# Patient Record
Sex: Male | Born: 1960 | Race: Black or African American | Hispanic: No | Smoking: Current every day smoker
Health system: Southern US, Community
[De-identification: ages and names within clinical notes are randomized; demographics above are authoritative.]

## PROBLEM LIST (undated history)

## (undated) DIAGNOSIS — G9341 Metabolic encephalopathy: Secondary | ICD-10-CM

## (undated) DIAGNOSIS — D649 Anemia, unspecified: Secondary | ICD-10-CM

---

## 2005-03-11 ENCOUNTER — Emergency Department: Payer: Self-pay | Admitting: Emergency Medicine

## 2005-05-29 ENCOUNTER — Emergency Department: Payer: Self-pay | Admitting: Emergency Medicine

## 2005-05-30 ENCOUNTER — Emergency Department: Payer: Self-pay | Admitting: Emergency Medicine

## 2014-02-27 ENCOUNTER — Emergency Department: Payer: Self-pay | Admitting: Emergency Medicine

## 2014-07-16 ENCOUNTER — Emergency Department
Admit: 2014-07-16 | Disposition: A | Discharge status: Home / Self Care | Payer: Self-pay | Admitting: Emergency Medicine

## 2014-07-19 LAB — BETA STREP CULTURE(ARMC)

## 2016-03-03 IMAGING — CR NECK SOFT TISSUES - 1+ VIEW
1 series · 2 of 2 positions shown · non-contrast
Comparison: None.

CLINICAL DATA: Dysphagia with feeling of fullness in throat for 1
day

EXAM:
NECK SOFT TISSUES - 1+ VIEW

[Series 1: dxr soft tissue neck · 0.14mm/px · 2 of 2 slices shown]
[im 1/2]
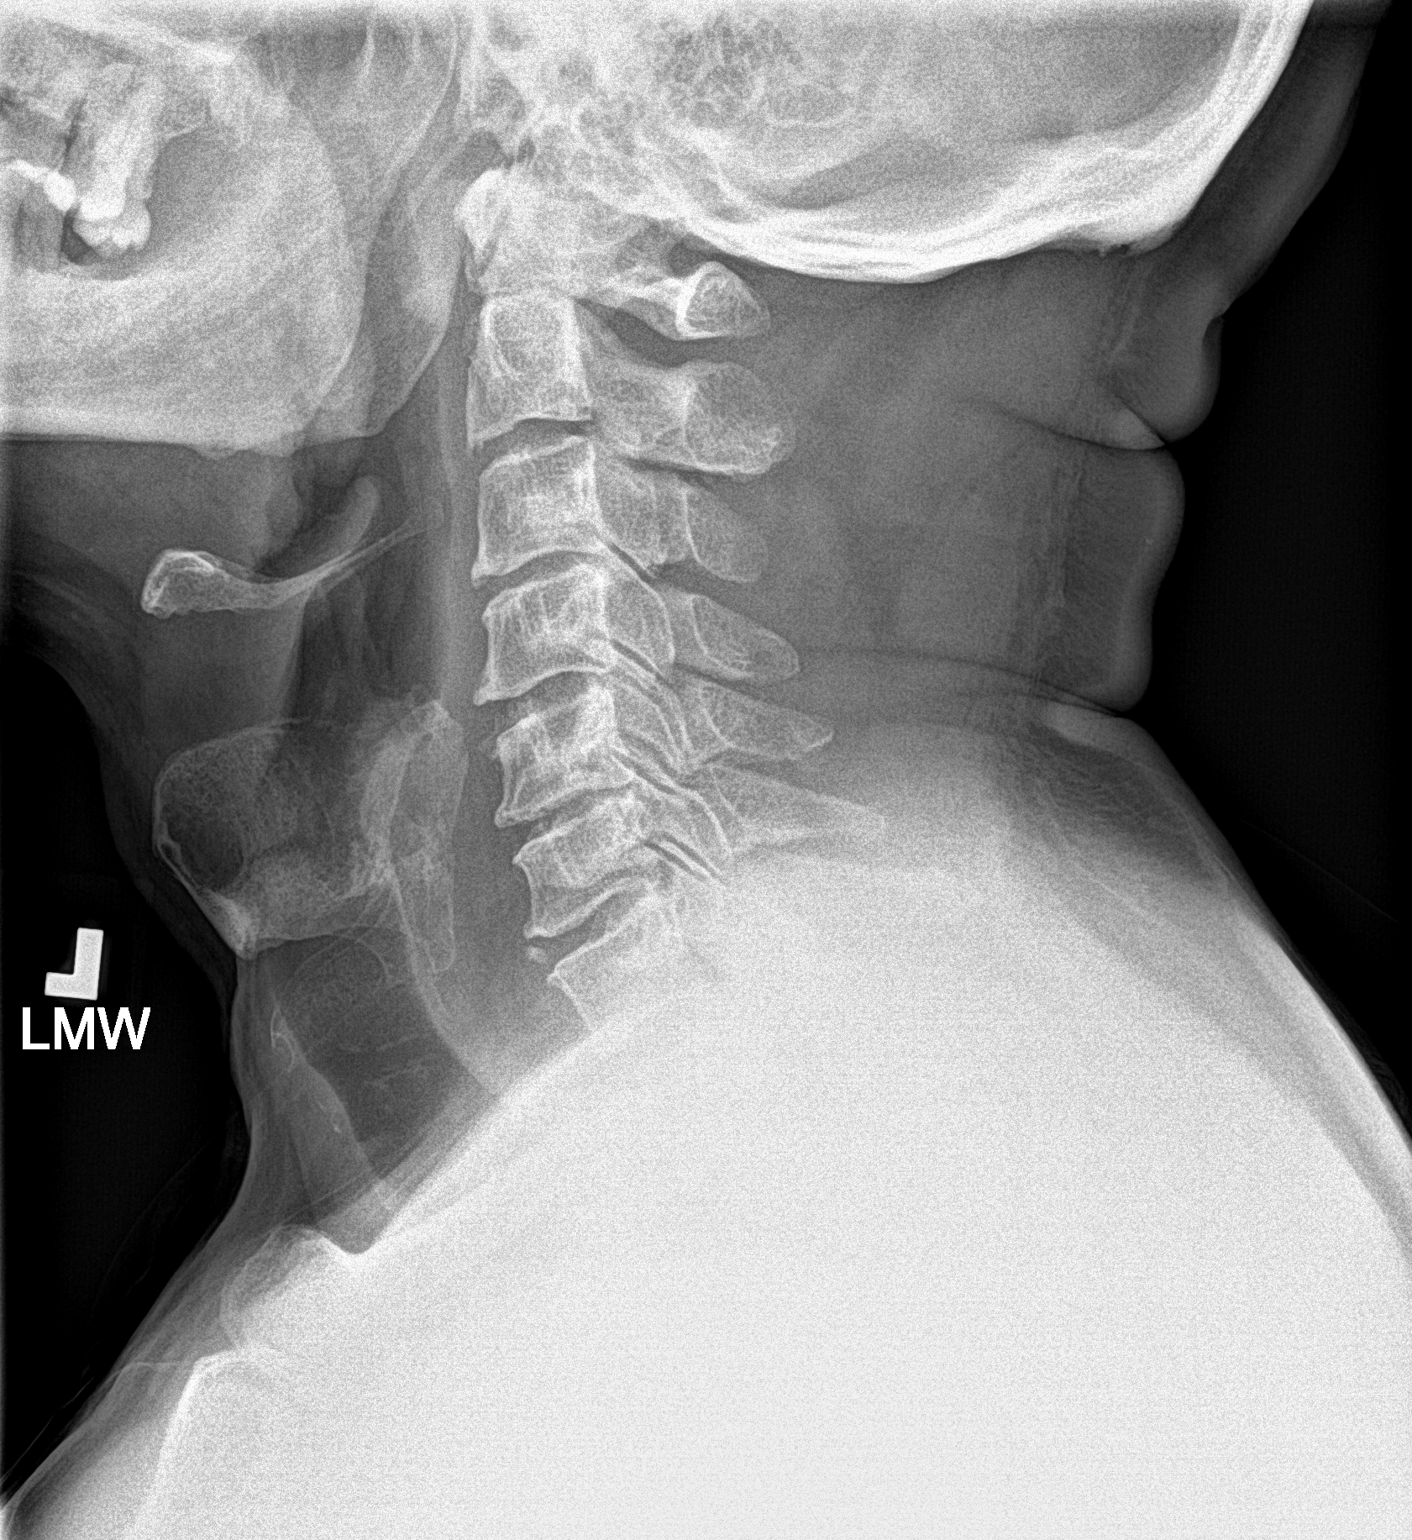
[im 2/2]
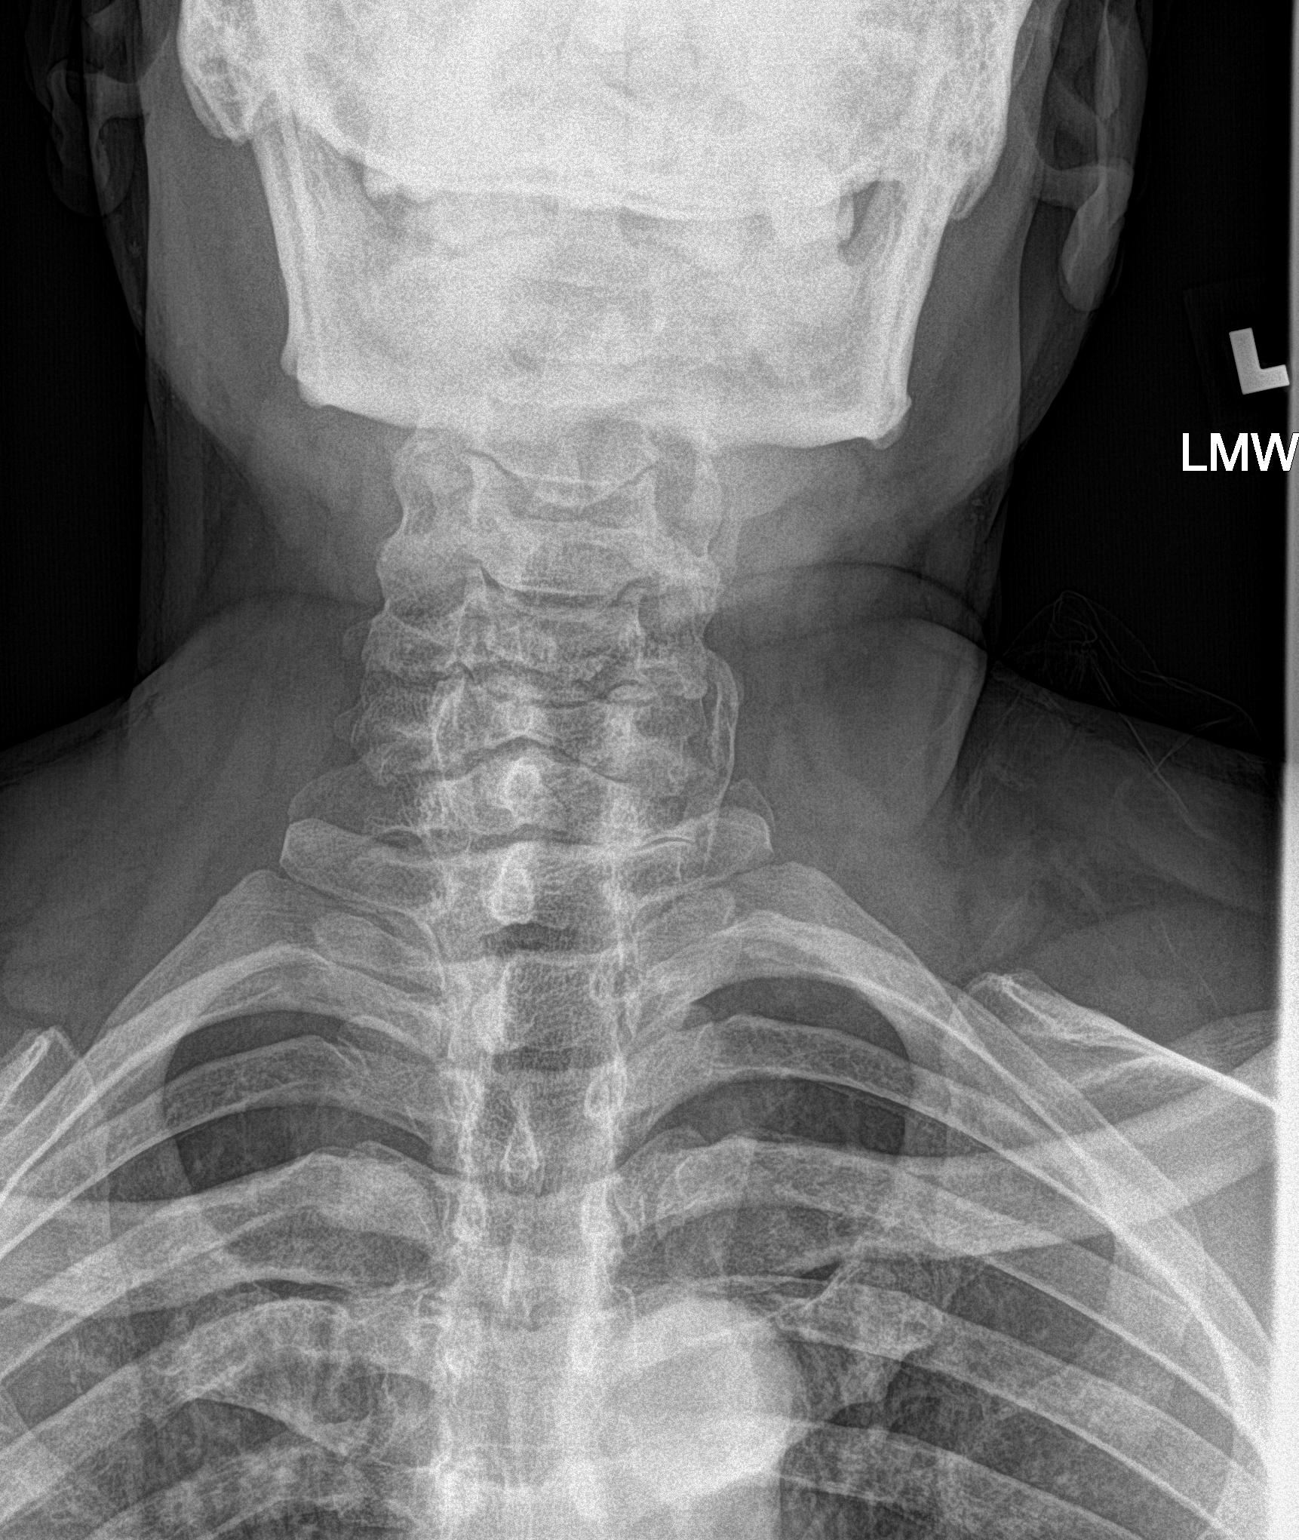

[2 of 2 positions shown; findings below may reference images not displayed]

FINDINGS: Frontal and lateral views were obtained. Epiglottis and
aryepiglottic folds appear normal. Prevertebral soft tissues are
normal. There is no air-fluid level. Tongue base region appears
normal. There is no appreciable air column narrowing. There is
degenerative type change in the cervical spine. No radiopaque
foreign body identified.
IMPRESSION: Pharyngeal airway and soft tissues appear unremarkable. No
radiopaque foreign body appreciable. Moderate degenerative type
change in cervical spine.

## 2022-07-30 ENCOUNTER — Emergency Department (HOSPITAL_COMMUNITY): Payer: Self-pay

## 2022-07-30 ENCOUNTER — Other Ambulatory Visit: Payer: Self-pay

## 2022-07-30 ENCOUNTER — Inpatient Hospital Stay (HOSPITAL_COMMUNITY)
Admission: EM | Admit: 2022-07-30 | Discharge: 2022-08-03 | DRG: 896 | Disposition: A | Discharge status: Home / Self Care | Payer: Self-pay | Attending: Internal Medicine | Admitting: Internal Medicine

## 2022-07-30 DIAGNOSIS — G9340 Encephalopathy, unspecified: Secondary | ICD-10-CM

## 2022-07-30 DIAGNOSIS — M109 Gout, unspecified: Secondary | ICD-10-CM | POA: Diagnosis present

## 2022-07-30 DIAGNOSIS — E860 Dehydration: Secondary | ICD-10-CM | POA: Diagnosis present

## 2022-07-30 DIAGNOSIS — E874 Mixed disorder of acid-base balance: Secondary | ICD-10-CM | POA: Diagnosis present

## 2022-07-30 DIAGNOSIS — F10231 Alcohol dependence with withdrawal delirium: Principal | ICD-10-CM | POA: Diagnosis present

## 2022-07-30 DIAGNOSIS — Y900 Blood alcohol level of less than 20 mg/100 ml: Secondary | ICD-10-CM | POA: Diagnosis present

## 2022-07-30 DIAGNOSIS — A0472 Enterocolitis due to Clostridium difficile, not specified as recurrent: Secondary | ICD-10-CM | POA: Diagnosis present

## 2022-07-30 DIAGNOSIS — R748 Abnormal levels of other serum enzymes: Secondary | ICD-10-CM | POA: Diagnosis present

## 2022-07-30 DIAGNOSIS — E861 Hypovolemia: Secondary | ICD-10-CM | POA: Diagnosis present

## 2022-07-30 DIAGNOSIS — E876 Hypokalemia: Secondary | ICD-10-CM

## 2022-07-30 DIAGNOSIS — Z1152 Encounter for screening for COVID-19: Secondary | ICD-10-CM

## 2022-07-30 DIAGNOSIS — E872 Acidosis, unspecified: Secondary | ICD-10-CM

## 2022-07-30 DIAGNOSIS — J320 Chronic maxillary sinusitis: Secondary | ICD-10-CM | POA: Diagnosis present

## 2022-07-30 DIAGNOSIS — E878 Other disorders of electrolyte and fluid balance, not elsewhere classified: Secondary | ICD-10-CM | POA: Diagnosis present

## 2022-07-30 DIAGNOSIS — Z781 Physical restraint status: Secondary | ICD-10-CM

## 2022-07-30 DIAGNOSIS — F10931 Alcohol use, unspecified with withdrawal delirium: Secondary | ICD-10-CM

## 2022-07-30 DIAGNOSIS — M79674 Pain in right toe(s): Secondary | ICD-10-CM | POA: Diagnosis present

## 2022-07-30 DIAGNOSIS — M79675 Pain in left toe(s): Secondary | ICD-10-CM | POA: Diagnosis present

## 2022-07-30 DIAGNOSIS — N179 Acute kidney failure, unspecified: Secondary | ICD-10-CM

## 2022-07-30 DIAGNOSIS — R112 Nausea with vomiting, unspecified: Secondary | ICD-10-CM

## 2022-07-30 DIAGNOSIS — F17290 Nicotine dependence, other tobacco product, uncomplicated: Secondary | ICD-10-CM | POA: Diagnosis present

## 2022-07-30 DIAGNOSIS — G9341 Metabolic encephalopathy: Secondary | ICD-10-CM

## 2022-07-30 DIAGNOSIS — W07XXXA Fall from chair, initial encounter: Secondary | ICD-10-CM | POA: Diagnosis not present

## 2022-07-30 DIAGNOSIS — E871 Hypo-osmolality and hyponatremia: Secondary | ICD-10-CM | POA: Diagnosis present

## 2022-07-30 DIAGNOSIS — R4182 Altered mental status, unspecified: Secondary | ICD-10-CM

## 2022-07-30 LAB — URINALYSIS, ROUTINE W REFLEX MICROSCOPIC
Bacteria, UA: NONE SEEN
Bilirubin Urine: NEGATIVE
Glucose, UA: NEGATIVE mg/dL
Ketones, ur: NEGATIVE mg/dL
Leukocytes,Ua: NEGATIVE
Nitrite: NEGATIVE
Protein, ur: NEGATIVE mg/dL
Specific Gravity, Urine: 1.004 — ABNORMAL LOW (ref 1.005–1.030)
pH: 7 (ref 5.0–8.0)

## 2022-07-30 LAB — CBG MONITORING, ED: Glucose-Capillary: 140 mg/dL — ABNORMAL HIGH (ref 70–99)

## 2022-07-30 LAB — CBC
HCT: 40.5 % (ref 39.0–52.0)
Hemoglobin: 14 g/dL (ref 13.0–17.0)
MCH: 30 pg (ref 26.0–34.0)
MCHC: 34.6 g/dL (ref 30.0–36.0)
MCV: 86.9 fL (ref 80.0–100.0)
Platelets: 200 10*3/uL (ref 150–400)
RBC: 4.66 MIL/uL (ref 4.22–5.81)
RDW: 11.6 % (ref 11.5–15.5)
WBC: 7.7 10*3/uL (ref 4.0–10.5)
nRBC: 0 % (ref 0.0–0.2)

## 2022-07-30 LAB — BASIC METABOLIC PANEL
Anion gap: 18 — ABNORMAL HIGH (ref 5–15)
Anion gap: 17 — ABNORMAL HIGH (ref 5–15)
BUN: 9 mg/dL (ref 8–23)
BUN: 10 mg/dL (ref 8–23)
CO2: 31 mmol/L (ref 22–32)
CO2: 30 mmol/L (ref 22–32)
Calcium: 9.3 mg/dL (ref 8.9–10.3)
Calcium: 9.2 mg/dL (ref 8.9–10.3)
Chloride: 71 mmol/L — ABNORMAL LOW (ref 98–111)
Chloride: 79 mmol/L — ABNORMAL LOW (ref 98–111)
Creatinine, Ser: 1.22 mg/dL (ref 0.61–1.24)
Creatinine, Ser: 1.15 mg/dL (ref 0.61–1.24)
GFR, Estimated: >60 mL/min (ref >60)
GFR, Estimated: >60 mL/min (ref >60)
Glucose, Bld: 103 mg/dL — ABNORMAL HIGH (ref 70–99)
Glucose, Bld: 94 mg/dL (ref 70–99)
Potassium: 2.5 mmol/L — CL (ref 3.5–5.1)
Potassium: 3.4 mmol/L — ABNORMAL LOW (ref 3.5–5.1)
Sodium: 120 mmol/L — ABNORMAL LOW (ref 135–145)
Sodium: 126 mmol/L — ABNORMAL LOW (ref 135–145)

## 2022-07-30 LAB — CBC WITH DIFFERENTIAL/PLATELET
Abs Immature Granulocytes: 0.04 10*3/uL (ref 0.00–0.07)
Basophils Absolute: 0 10*3/uL (ref 0.0–0.1)
Basophils Relative: 0 %
Eosinophils Absolute: 0.1 10*3/uL (ref 0.0–0.5)
Eosinophils Relative: 1 %
HCT: 42.6 % (ref 39.0–52.0)
Hemoglobin: 15.1 g/dL (ref 13.0–17.0)
Immature Granulocytes: 1 %
Lymphocytes Relative: 13 %
Lymphs Abs: 1.1 10*3/uL (ref 0.7–4.0)
MCH: 30.4 pg (ref 26.0–34.0)
MCHC: 35.4 g/dL (ref 30.0–36.0)
MCV: 85.9 fL (ref 80.0–100.0)
Monocytes Absolute: 0.8 10*3/uL (ref 0.1–1.0)
Monocytes Relative: 9 %
Neutro Abs: 6.4 10*3/uL (ref 1.7–7.7)
Neutrophils Relative %: 76 %
Platelets: 203 10*3/uL (ref 150–400)
RBC: 4.96 MIL/uL (ref 4.22–5.81)
RDW: 11.5 % (ref 11.5–15.5)
WBC: 8.4 10*3/uL (ref 4.0–10.5)
nRBC: 0 % (ref 0.0–0.2)

## 2022-07-30 LAB — LACTIC ACID, PLASMA
Lactic Acid, Venous: 2.2 mmol/L (ref 0.5–1.9)
Lactic Acid, Venous: 1.6 mmol/L (ref 0.5–1.9)

## 2022-07-30 LAB — COMPREHENSIVE METABOLIC PANEL
ALT: 62 U/L — ABNORMAL HIGH (ref 0–44)
AST: 161 U/L — ABNORMAL HIGH (ref 15–41)
Albumin: 4 g/dL (ref 3.5–5.0)
Alkaline Phosphatase: 59 U/L (ref 38–126)
Anion gap: 20 — ABNORMAL HIGH (ref 5–15)
BUN: 12 mg/dL (ref 8–23)
CO2: 33 mmol/L — ABNORMAL HIGH (ref 22–32)
Calcium: 9.3 mg/dL (ref 8.9–10.3)
Chloride: 65 mmol/L — ABNORMAL LOW (ref 98–111)
Creatinine, Ser: 1.39 mg/dL — ABNORMAL HIGH (ref 0.61–1.24)
GFR, Estimated: 57 mL/min — ABNORMAL LOW (ref >60)
Glucose, Bld: 107 mg/dL — ABNORMAL HIGH (ref 70–99)
Potassium: 2.8 mmol/L — ABNORMAL LOW (ref 3.5–5.1)
Sodium: 118 mmol/L — CL (ref 135–145)
Total Bilirubin: 1.9 mg/dL — ABNORMAL HIGH (ref 0.3–1.2)
Total Protein: 7.3 g/dL (ref 6.5–8.1)

## 2022-07-30 LAB — OSMOLALITY, URINE: Osmolality, Ur: 137 mOsm/kg — ABNORMAL LOW (ref 300–900)

## 2022-07-30 LAB — HIV ANTIBODY (ROUTINE TESTING W REFLEX): HIV Screen 4th Generation wRfx: NONREACTIVE

## 2022-07-30 LAB — SODIUM
Sodium: 124 mmol/L — ABNORMAL LOW (ref 135–145)
Sodium: 121 mmol/L — ABNORMAL LOW (ref 135–145)
Sodium: 130 mmol/L — ABNORMAL LOW (ref 135–145)

## 2022-07-30 LAB — TROPONIN I (HIGH SENSITIVITY)
Troponin I (High Sensitivity): 27 ng/L — ABNORMAL HIGH (ref <18)
Troponin I (High Sensitivity): 26 ng/L — ABNORMAL HIGH (ref <18)

## 2022-07-30 LAB — SODIUM, URINE, RANDOM: Sodium, Ur: 18 mmol/L

## 2022-07-30 LAB — GLUCOSE, CAPILLARY: Glucose-Capillary: 121 mg/dL — ABNORMAL HIGH (ref 70–99)

## 2022-07-30 LAB — MAGNESIUM: Magnesium: 1.8 mg/dL (ref 1.7–2.4)

## 2022-07-30 LAB — RAPID URINE DRUG SCREEN, HOSP PERFORMED
Amphetamines: NOT DETECTED
Barbiturates: NOT DETECTED
Benzodiazepines: NOT DETECTED
Cocaine: NOT DETECTED
Opiates: NOT DETECTED
Tetrahydrocannabinol: NOT DETECTED

## 2022-07-30 LAB — MRSA NEXT GEN BY PCR, NASAL: MRSA by PCR Next Gen: NOT DETECTED

## 2022-07-30 LAB — APTT: aPTT: 30 seconds (ref 24–36)

## 2022-07-30 LAB — CREATININE, SERUM
Creatinine, Ser: 1.19 mg/dL (ref 0.61–1.24)
GFR, Estimated: >60 mL/min (ref >60)

## 2022-07-30 LAB — ETHANOL: Alcohol, Ethyl (B): <10 mg/dL (ref <10)

## 2022-07-30 LAB — SARS CORONAVIRUS 2 BY RT PCR: SARS Coronavirus 2 by RT PCR: NEGATIVE

## 2022-07-30 LAB — LIPASE, BLOOD: Lipase: 40 U/L (ref 11–51)

## 2022-07-30 LAB — PROTIME-INR
INR: 0.9 (ref 0.8–1.2)
Prothrombin Time: 12.5 seconds (ref 11.4–15.2)

## 2022-07-30 MED ORDER — POTASSIUM CHLORIDE 10 MEQ/100ML IV SOLN
10.0000 meq | INTRAVENOUS | Status: AC
  Administered 2022-07-30 (×4): 10 meq via INTRAVENOUS
  Filled 2022-07-30 (×4): qty 100

## 2022-07-30 MED ORDER — FOLIC ACID 1 MG PO TABS
1.0000 mg | ORAL_TABLET | Freq: Every day | ORAL | Status: DC
  Administered 2022-07-30 – 2022-08-03 (×4): 1 mg via ORAL
  Filled 2022-07-30 (×5): qty 1

## 2022-07-30 MED ORDER — POTASSIUM CHLORIDE 10 MEQ/100ML IV SOLN
10.0000 meq | INTRAVENOUS | Status: AC
  Administered 2022-07-30 (×2): 10 meq via INTRAVENOUS
  Filled 2022-07-30: qty 100

## 2022-07-30 MED ORDER — LORAZEPAM 2 MG/ML IJ SOLN
1.0000 mg | INTRAMUSCULAR | Status: DC | PRN
  Administered 2022-07-30: 4 mg via INTRAVENOUS
  Administered 2022-07-30 (×2): 2 mg via INTRAVENOUS
  Administered 2022-07-31 (×2): 3 mg via INTRAVENOUS
  Administered 2022-07-31 (×2): 4 mg via INTRAVENOUS
  Administered 2022-07-31 (×2): 3 mg via INTRAVENOUS
  Filled 2022-07-30 (×2): qty 2
  Filled 2022-07-30: qty 1
  Filled 2022-07-30 (×3): qty 2
  Filled 2022-07-30: qty 1
  Filled 2022-07-30 (×3): qty 2

## 2022-07-30 MED ORDER — LORAZEPAM 2 MG/ML IJ SOLN
1.0000 mg | Freq: Once | INTRAMUSCULAR | Status: AC
  Administered 2022-07-30: 1 mg via INTRAVENOUS
  Filled 2022-07-30: qty 1

## 2022-07-30 MED ORDER — MAGNESIUM SULFATE 2 GM/50ML IV SOLN
2.0000 g | Freq: Once | INTRAVENOUS | Status: AC
  Administered 2022-07-30: 2 g via INTRAVENOUS
  Filled 2022-07-30: qty 50

## 2022-07-30 MED ORDER — ONDANSETRON HCL 4 MG/2ML IJ SOLN: 4.0000 mg | Freq: Four times a day (QID) | INTRAMUSCULAR | Status: DC | PRN

## 2022-07-30 MED ORDER — CHLORHEXIDINE GLUCONATE CLOTH 2 % EX PADS
6.0000 | MEDICATED_PAD | Freq: Every day | CUTANEOUS | Status: DC
  Administered 2022-07-30 – 2022-08-03 (×5): 6 via TOPICAL

## 2022-07-30 MED ORDER — THIAMINE MONONITRATE 100 MG PO TABS
100.0000 mg | ORAL_TABLET | Freq: Every day | ORAL | Status: DC
  Administered 2022-08-01 – 2022-08-03 (×3): 100 mg via ORAL
  Filled 2022-07-30 (×3): qty 1

## 2022-07-30 MED ORDER — THIAMINE HCL 100 MG/ML IJ SOLN
100.0000 mg | Freq: Every day | INTRAMUSCULAR | Status: DC
  Administered 2022-07-30 – 2022-07-31 (×2): 100 mg via INTRAVENOUS
  Filled 2022-07-30 (×3): qty 2

## 2022-07-30 MED ORDER — ADULT MULTIVITAMIN W/MINERALS CH
1.0000 | ORAL_TABLET | Freq: Every day | ORAL | Status: DC
  Administered 2022-07-30 – 2022-08-03 (×4): 1 via ORAL
  Filled 2022-07-30 (×5): qty 1

## 2022-07-30 MED ORDER — HEPARIN SODIUM (PORCINE) 5000 UNIT/ML IJ SOLN
5000.0000 [IU] | Freq: Three times a day (TID) | INTRAMUSCULAR | Status: DC
  Administered 2022-07-30 – 2022-08-03 (×13): 5000 [IU] via SUBCUTANEOUS
  Filled 2022-07-30 (×13): qty 1

## 2022-07-30 MED ORDER — POLYETHYLENE GLYCOL 3350 17 G PO PACK: 17.0000 g | PACK | Freq: Every day | ORAL | Status: DC | PRN

## 2022-07-30 MED ORDER — LACTATED RINGERS IV BOLUS
1000.0000 mL | Freq: Once | INTRAVENOUS | Status: AC
  Administered 2022-07-30: 1000 mL via INTRAVENOUS

## 2022-07-30 MED ORDER — ONDANSETRON HCL 4 MG/2ML IJ SOLN
4.0000 mg | Freq: Once | INTRAMUSCULAR | Status: AC
  Administered 2022-07-30: 4 mg via INTRAVENOUS
  Filled 2022-07-30: qty 2

## 2022-07-30 MED ORDER — DOCUSATE SODIUM 100 MG PO CAPS: 100.0000 mg | ORAL_CAPSULE | Freq: Two times a day (BID) | ORAL | Status: DC | PRN

## 2022-07-30 MED ORDER — IOHEXOL 350 MG/ML SOLN
75.0000 mL | Freq: Once | INTRAVENOUS | Status: AC | PRN
  Administered 2022-07-30: 75 mL via INTRAVENOUS

## 2022-07-30 MED ORDER — LORAZEPAM 1 MG PO TABS
1.0000 mg | ORAL_TABLET | ORAL | Status: DC | PRN
  Administered 2022-08-01: 2 mg via ORAL
  Filled 2022-07-30 (×2): qty 1
  Filled 2022-07-30: qty 4

## 2022-07-30 MED ORDER — SODIUM CHLORIDE 0.9 % IV SOLN: INTRAVENOUS | Status: DC
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
## 2022-07-30 NOTE — ED Notes (Signed)
Provider made aware of critical low Na

## 2022-07-30 NOTE — Progress Notes (Signed)
Notified NP Bowser that patient's blood pressure is 153/134. NP acknowledged and stated to treat DTs first and monitor blood pressure from there.
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
## 2022-07-30 NOTE — ED Notes (Signed)
Pt currently diaphoretic and having tremors. Tremors are not normal for pt. Pt denies nausea and has not had any episodes of vomiting since arrival.

## 2022-07-30 NOTE — ED Triage Notes (Signed)
Pt BIB EMS from home. Per EMS, pt has had vomiting for two days and has been trembling since last night. Pt has abrasions on chest and abdomen with unknown origin. Pt denies pain or nausea at this time. A/Ox4.

## 2022-07-30 NOTE — Progress Notes (Signed)
Sent secure chat to NP Bowser and made her aware of critical potassium of 2.5, NP acknowledged and placed order.

## 2022-07-30 NOTE — ED Notes (Signed)
Patient transported to CT 

## 2022-07-31 DIAGNOSIS — G9341 Metabolic encephalopathy: Principal | ICD-10-CM

## 2022-07-31 LAB — BASIC METABOLIC PANEL
Anion gap: 20 — ABNORMAL HIGH (ref 5–15)
BUN: 8 mg/dL (ref 8–23)
CO2: 27 mmol/L (ref 22–32)
Calcium: 8.7 mg/dL — ABNORMAL LOW (ref 8.9–10.3)
Chloride: 85 mmol/L — ABNORMAL LOW (ref 98–111)
Creatinine, Ser: 1.04 mg/dL (ref 0.61–1.24)
GFR, Estimated: >60 mL/min (ref >60)
Glucose, Bld: 93 mg/dL (ref 70–99)
Potassium: 3.1 mmol/L — ABNORMAL LOW (ref 3.5–5.1)
Sodium: 132 mmol/L — ABNORMAL LOW (ref 135–145)

## 2022-07-31 LAB — SODIUM
Sodium: 130 mmol/L — ABNORMAL LOW (ref 135–145)
Sodium: 131 mmol/L — ABNORMAL LOW (ref 135–145)
Sodium: 132 mmol/L — ABNORMAL LOW (ref 135–145)
Sodium: 132 mmol/L — ABNORMAL LOW (ref 135–145)

## 2022-07-31 LAB — CLOSTRIDIUM DIFFICILE BY PCR, REFLEXED: Toxigenic C. Difficile by PCR: POSITIVE — AB

## 2022-07-31 LAB — C DIFFICILE QUICK SCREEN W PCR REFLEX
C Diff antigen: POSITIVE — AB
C Diff toxin: NEGATIVE

## 2022-07-31 LAB — MAGNESIUM: Magnesium: 2.3 mg/dL (ref 1.7–2.4)

## 2022-07-31 LAB — CALCIUM, IONIZED: Calcium, Ionized, Serum: 4.8 mg/dL (ref 4.5–5.6)

## 2022-07-31 LAB — CULTURE, BLOOD (ROUTINE X 2)

## 2022-07-31 MED ORDER — POTASSIUM CHLORIDE 10 MEQ/100ML IV SOLN
INTRAVENOUS | Status: AC
  Administered 2022-07-31: 10 meq via INTRAVENOUS
  Filled 2022-07-31: qty 100

## 2022-07-31 MED ORDER — DEXTROSE 5 % IV SOLN: INTRAVENOUS | Status: DC

## 2022-07-31 MED ORDER — FIDAXOMICIN 200 MG PO TABS
200.0000 mg | ORAL_TABLET | Freq: Two times a day (BID) | ORAL | Status: DC
  Filled 2022-07-31 (×2): qty 1

## 2022-07-31 MED ORDER — POTASSIUM CHLORIDE 10 MEQ/100ML IV SOLN
10.0000 meq | INTRAVENOUS | Status: AC
  Administered 2022-07-31 (×5): 10 meq via INTRAVENOUS
  Filled 2022-07-31 (×5): qty 100

## 2022-07-31 MED ORDER — DEXMEDETOMIDINE HCL IN NACL 400 MCG/100ML IV SOLN
0.0000 ug/kg/h | INTRAVENOUS | Status: DC
  Administered 2022-07-31 (×2): 0.4 ug/kg/h via INTRAVENOUS
  Filled 2022-07-31 (×2): qty 100

## 2022-07-31 MED ORDER — DEXMEDETOMIDINE HCL IN NACL 400 MCG/100ML IV SOLN: 0.0000 ug/kg/h | INTRAVENOUS | Status: DC

## 2022-07-31 NOTE — Progress Notes (Signed)
eLink Physician-Brief Progress Note Patient Name: Antonio Villegas DOB: 1960-06-27 MRN: [id number]   Date of Service  07/31/2022  HPI/Events of Note  Alcohol withdrawal with delirium  eICU Interventions  Precedex     Intervention Category Major Interventions: Delirium, psychosis, severe agitation - evaluation and management  Carilyn Goodpasture 07/31/2022, 11:01 PM
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
## 2022-07-31 NOTE — Progress Notes (Addendum)
eLink Physician-Brief Progress Note Patient Name: SIM CHOQUETTE DOB: 03-03-1961 MRN: [id number]   Date of Service  07/31/2022  HPI/Events of Note  62 year old male with a history of alcohol use disorder that presented with acute encephalopathy thought to be secondary to delirium tremens found to have severe alkali disturbances including sodium of 118.  Sodium now 130 for the last 2 checks.  Urgently called to the room for severe encephalopathy which appears to be resolved by the time team arrived at bedside, the patient received Ativan with appropriate affect.  eICU Interventions  Discussed with bedside, no additional sedation requested at this time, patient is not being restrained in is appropriate after the Ativan.  Goal sodium at 24 hours (8:30 AM on 4/21) is 124-126. Add on D5 infusion for now.    0301 -stable sodium, hold NS for now.  4098 -add bilateral wrist restraints, can initiate Precedex if the patient remains encephalopathic  Intervention Category Intermediate Interventions: Change in mental status - evaluation and management  Linnea Todisco 07/31/2022, 12:15 AM

## 2022-07-31 NOTE — Progress Notes (Signed)
[facility] ADULT ICU REPLACEMENT PROTOCOL   The patient does apply for the [facility] Adult ICU Electrolyte Replacment Protocol based on the criteria listed below:   1.Exclusion criteria: TCTS, ECMO, Dialysis, and Myasthenia Gravis patients 2. Is GFR >/= 30 ml/min? Yes.    Patient's GFR today is >60 3. Is SCr </= 2? Yes.   Patient's SCr is 1.04 mg/dL 4. Did SCr increase >/= 0.5 in 24 hours? No. 5.Pt's weight >40kg  Yes.   6. Abnormal electrolyte(s): K+3.1  7. Electrolytes replaced per protocol 8.  Call MD STAT for K+ </= 2.5, Phos </= 1, or Mag </= 1  Physician:  Dr. Loralyn Freshwater, Lilia Argue 07/31/2022 4:58 AM

## 2022-07-31 NOTE — Progress Notes (Signed)
Sent c diff screening with 1x diarrhea this morning triggering order panel.   Antigen +, Toxin -, PCR + little to no toxin production. With his encephalopathy, is difficult to know extent of sx illness. Has not had ongoing diarrhea, no leukocytosis, no fever. But with indeterminate range and relatively unknown hx, will add dificid for now.   Tessie Fass MSN, AGACNP-BC [facility] Pulmonary/Critical Care Medicine 07/31/2022, 4:47 PM

## 2022-08-01 LAB — CBC WITH DIFFERENTIAL/PLATELET
Abs Immature Granulocytes: 0.02 10*3/uL (ref 0.00–0.07)
Basophils Absolute: 0 10*3/uL (ref 0.0–0.1)
Basophils Relative: 0 %
Eosinophils Absolute: 0.1 10*3/uL (ref 0.0–0.5)
Eosinophils Relative: 1 %
HCT: 40.5 % (ref 39.0–52.0)
Hemoglobin: 13.4 g/dL (ref 13.0–17.0)
Immature Granulocytes: 0 %
Lymphocytes Relative: 21 %
Lymphs Abs: 1.2 10*3/uL (ref 0.7–4.0)
MCH: 30.1 pg (ref 26.0–34.0)
MCHC: 33.1 g/dL (ref 30.0–36.0)
MCV: 91 fL (ref 80.0–100.0)
Monocytes Absolute: 0.6 10*3/uL (ref 0.1–1.0)
Monocytes Relative: 10 %
Neutro Abs: 3.9 10*3/uL (ref 1.7–7.7)
Neutrophils Relative %: 68 %
Platelets: 161 10*3/uL (ref 150–400)
RBC: 4.45 MIL/uL (ref 4.22–5.81)
RDW: 11.8 % (ref 11.5–15.5)
WBC: 5.8 10*3/uL (ref 4.0–10.5)
nRBC: 0 % (ref 0.0–0.2)

## 2022-08-01 LAB — BASIC METABOLIC PANEL
Anion gap: 15 (ref 5–15)
BUN: 10 mg/dL (ref 8–23)
CO2: 28 mmol/L (ref 22–32)
Calcium: 8.4 mg/dL — ABNORMAL LOW (ref 8.9–10.3)
Chloride: 88 mmol/L — ABNORMAL LOW (ref 98–111)
Creatinine, Ser: 0.9 mg/dL (ref 0.61–1.24)
GFR, Estimated: >60 mL/min (ref >60)
Glucose, Bld: 102 mg/dL — ABNORMAL HIGH (ref 70–99)
Potassium: 2.7 mmol/L — CL (ref 3.5–5.1)
Sodium: 131 mmol/L — ABNORMAL LOW (ref 135–145)

## 2022-08-01 LAB — GLUCOSE, CAPILLARY
Glucose-Capillary: 130 mg/dL — ABNORMAL HIGH (ref 70–99)
Glucose-Capillary: 138 mg/dL — ABNORMAL HIGH (ref 70–99)

## 2022-08-01 LAB — PHOSPHORUS: Phosphorus: 2.5 mg/dL (ref 2.5–4.6)

## 2022-08-01 LAB — MAGNESIUM: Magnesium: 2.2 mg/dL (ref 1.7–2.4)

## 2022-08-01 LAB — SODIUM
Sodium: 132 mmol/L — ABNORMAL LOW (ref 135–145)
Sodium: 133 mmol/L — ABNORMAL LOW (ref 135–145)

## 2022-08-01 MED ORDER — POTASSIUM CHLORIDE 10 MEQ/100ML IV SOLN
10.0000 meq | INTRAVENOUS | Status: AC
  Administered 2022-08-01 (×8): 10 meq via INTRAVENOUS
  Filled 2022-08-01 (×8): qty 100

## 2022-08-01 MED ORDER — NICOTINE 14 MG/24HR TD PT24
14.0000 mg | MEDICATED_PATCH | Freq: Every day | TRANSDERMAL | Status: DC
  Administered 2022-08-01 – 2022-08-03 (×3): 14 mg via TRANSDERMAL
  Filled 2022-08-01 (×3): qty 1

## 2022-08-01 NOTE — TOC Progression Note (Signed)
Transition of Care [facility]) - Initial/Assessment Note    Patient Details  Name: Antonio Villegas MRN: [id number] Date of Birth: Mar 22, 1961  [facility]) CM/SW Contact:    Ralene Bathe, LCSWA Phone Number: 08/01/2022, 10:28 AM  Clinical Narrative:                 Transition of Care Department [facility]) has reviewed patient.  Patient is from home and  admitted for Hyponatremia.  TOC received consult for SA.  Patient is currently confused per MD note.  SA resources placed on AVS.  We will continue to monitor patient advancement through interdisciplinary progression rounds.         Patient Goals and CMS Choice            Expected Discharge Plan and Services                                              Prior Living Arrangements/Services                       Activities of Daily Living      Permission Sought/Granted                  Emotional Assessment              Admission diagnosis:  Metabolic encephalopathy [G93.41] Dehydration [E86.0] Acute hyponatremia [E87.1] Hypokalemia [E87.6] Lactic acidosis [E87.20] Hyponatremia [E87.1] Elevated liver enzymes [R74.8] Altered mental status, unspecified altered mental status type [R41.82] Nausea and vomiting, unspecified vomiting type [R11.2] Patient Active Problem List   Diagnosis Date Noted   Metabolic encephalopathy 07/31/2022   Hyponatremia 07/30/2022   Encephalopathy acute 07/30/2022   Delirium tremens 07/30/2022   Hypokalemia 07/30/2022   AKI (acute kidney injury) 07/30/2022   PCP:  Patient, No Pcp Per Pharmacy:   [facility] - [place] - [address] [place]) [place] [address] Phone: [phone] Fax: [phone]     Social Determinants of Health (SDOH) Social History:   SDOH Interventions:     Readmission Risk Interventions     No data to display

## 2022-08-01 NOTE — Discharge Instructions (Signed)
Outpatient Providers   [facility] ([facility]) Group and individual counseling. [address]  [place], [place] [address] [phone] [place]: [phone]  [place]: [phone] Medicaid and uninsured.   [facility] Offers IOP groups multiple times per week. [address] [place], [place] [address] [phone] Takes Medicaid and other insurances.   [facility]  Chemical Dependency Intensive Outpatient Program (IOP) [address] [place], [place] [address] [phone] Takes Nurse, learning disability and [place].   [facility]  IOP and Partial Hospitalization Program  [address]  [place], [place] [address] [phone] Private Insurance, [place] only for partial hospitalization     [facility] ([facility]) IOP, individual counseling, medication management [address] [place], [place] [address] [phone] Medicaid and [facility]  [facility] [address]  [place], [place] [address] [phone] Private Insurance and Self Pay   [facility] [address]  [place], [place] [address] [phone] Private Insurance, [place], and Self Pay   [facility]  [address] [place], [place] [address] [facility]  [address]  [place], [place] [address] [phone]  [facility]  [address] [place], [place] [address] [phone]      Residential Treatment Programs  [facility] [address] [place], [place] [address] [phone] or [phone] Detox and Residential Rehab 14 days (Medicare, Medicaid, private insurance, and self pay)  [facility] [address]  [place], [place] [address] [phone] Detox (self Pay and Medicaid Limited availability) Rehab Only Male (Medicare, [place], and  Self Pay)  [facility] [address] [place], [place] [address] [phone] or [phone] Private Insurance only  [facility]  [address] [place]  [place], [place] [address] [phone] Treatment Only, must make assessment appointment, and must be sober for assessment appointment. Self pay, Medicare A and B, [facility], must be [facility] resident.      Residential Treatment Programs  [facility] [address]  [place] , [place]  [phone] [employer], [place], [place]. They offer assistance with transportation.   [facility] [address] [place],  [place], [place] [address] [phone] [facility] No insurance     [facility]  [address] [place], [place] [address] [phone] No pending legal charges, Long-term work program  [facility]: [facility]  [address]  [place], [place] [address] [address], [place], [place] [address] Residential treatment (takes people on Methadone/Suboxone)  Medicaid and uninsured  [facility]  [address] [place], [place] [address] [phone] or [phone] [employer] Insurance Only [facility] - [phone] Private Insurance Males/Females, call to make referrals, multiple facilities   [facility] [address]  [place], [place] [address]  [phone] Men Only Upfront Fee [facility] [address]      [facility]: [facility] [address] [place], [place] [address] [phone]  [facility] [address] [place], [place] [address] [phone] (f)  [facility] [address] [place], [place] [address] [phone] ([phone] (f)
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
## 2022-08-01 NOTE — Progress Notes (Signed)
[facility] ADULT ICU REPLACEMENT PROTOCOL   The patient does apply for the [facility] Adult ICU Electrolyte Replacment Protocol based on the criteria listed below:   1.Exclusion criteria: TCTS, ECMO, Dialysis, and Myasthenia Gravis patients 2. Is GFR >/= 30 ml/min? Yes.    Patient's GFR today is >60 3. Is SCr </= 2? Yes.   Patient's SCr is 0.90 mg/dL 4. Did SCr increase >/= 0.5 in 24 hours? No. 5.Pt's weight >40kg  Yes.   6. Abnormal electrolyte(s): K+2.7  7. Electrolytes replaced per protocol 8.  Call MD STAT for K+ </= 2.5, Phos </= 1, or Mag </= 1 Physician:  Dr.Osei  Antonio Villegas 08/01/2022 4:16 AM

## 2022-08-01 NOTE — Progress Notes (Signed)
eLink Physician-Brief Progress Note Patient Name: CHAYIM BIALAS DOB: 1961-03-16 MRN: [id number]   Date of Service  08/01/2022  HPI/Events of Note  62 year old male found confused thought to be secondary to either alcohol withdrawal or potentially symptomatic hyponatremia.  Called and notified that the patient was getting up out of the chair and fell backwards.  Had no head injury.  He states he has no joint pain or discomfort.  His only request is for additional fruit cups.  eICU Interventions  Continue prophylactic heparin as previously scheduled.  No indication for further imaging at this time.   0540 - K+ 3.1, Crt 1.00 - Kcl ordered  Intervention Category Minor Interventions: Clinical assessment - ordering diagnostic tests  Lanna Labella 08/01/2022, 10:19 PM

## 2022-08-01 NOTE — Plan of Care (Signed)

## 2022-08-02 LAB — BASIC METABOLIC PANEL
Anion gap: 11 (ref 5–15)
BUN: 16 mg/dL (ref 8–23)
CO2: 25 mmol/L (ref 22–32)
Calcium: 8.3 mg/dL — ABNORMAL LOW (ref 8.9–10.3)
Chloride: 95 mmol/L — ABNORMAL LOW (ref 98–111)
Creatinine, Ser: 1 mg/dL (ref 0.61–1.24)
GFR, Estimated: >60 mL/min (ref >60)
Glucose, Bld: 122 mg/dL — ABNORMAL HIGH (ref 70–99)
Potassium: 3.1 mmol/L — ABNORMAL LOW (ref 3.5–5.1)
Sodium: 131 mmol/L — ABNORMAL LOW (ref 135–145)

## 2022-08-02 LAB — CBC
HCT: 39.9 % (ref 39.0–52.0)
Hemoglobin: 13.7 g/dL (ref 13.0–17.0)
MCH: 30.4 pg (ref 26.0–34.0)
MCHC: 34.3 g/dL (ref 30.0–36.0)
MCV: 88.7 fL (ref 80.0–100.0)
Platelets: 196 10*3/uL (ref 150–400)
RBC: 4.5 MIL/uL (ref 4.22–5.81)
RDW: 12 % (ref 11.5–15.5)
WBC: 6.4 10*3/uL (ref 4.0–10.5)
nRBC: 0 % (ref 0.0–0.2)

## 2022-08-02 LAB — GLUCOSE, CAPILLARY
Glucose-Capillary: 109 mg/dL — ABNORMAL HIGH (ref 70–99)
Glucose-Capillary: 154 mg/dL — ABNORMAL HIGH (ref 70–99)
Glucose-Capillary: 137 mg/dL — ABNORMAL HIGH (ref 70–99)

## 2022-08-02 LAB — MAGNESIUM: Magnesium: 2 mg/dL (ref 1.7–2.4)

## 2022-08-02 LAB — URIC ACID: Uric Acid, Serum: 7.3 mg/dL (ref 3.7–8.6)

## 2022-08-02 LAB — CULTURE, BLOOD (ROUTINE X 2)
Culture: NO GROWTH
Special Requests: ADEQUATE

## 2022-08-02 LAB — SODIUM: Sodium: 136 mmol/L (ref 135–145)

## 2022-08-02 MED ORDER — BENZONATATE 100 MG PO CAPS
200.0000 mg | ORAL_CAPSULE | Freq: Three times a day (TID) | ORAL | Status: DC | PRN
  Administered 2022-08-03 (×2): 200 mg via ORAL
  Filled 2022-08-02 (×3): qty 2

## 2022-08-02 MED ORDER — POTASSIUM CHLORIDE 20 MEQ PO PACK
60.0000 meq | PACK | Freq: Two times a day (BID) | ORAL | Status: AC
  Administered 2022-08-02 (×2): 60 meq via ORAL
  Filled 2022-08-02 (×2): qty 3

## 2022-08-02 MED ORDER — COLCHICINE 0.6 MG PO TABS
1.2000 mg | ORAL_TABLET | Freq: Once | ORAL | Status: AC
  Administered 2022-08-02: 1.2 mg via ORAL
  Filled 2022-08-02: qty 2

## 2022-08-02 NOTE — TOC Progression Note (Signed)
Transition of Care [facility]) - Initial/Assessment Note    Patient Details  Name: Antonio Villegas MRN: [id number] Date of Birth: 1960-08-29  [facility]) CM/SW Contact:    Ralene Bathe, LCSWA Phone Number: 08/02/2022, 2:11 PM  Clinical Narrative:                 LCSW received consult to speak with the patient's brother in reference to housing.  LCSW contacted the patient's brother, Lula Michaux, and informed of some of the resources in [place] as the brother reports that the patient will not have housing at discharge.  LCSW informed brother that once the patient is more alert, a more in depth conversation can take place with the patient.  The brother reports that it would be best if the patient could go to rehab at discharge.  LCSW explained that the patient would have to be willing to go to rehab before this process could begin.    TOC following.         Patient Goals and CMS Choice            Expected Discharge Plan and Services                                              Prior Living Arrangements/Services                       Activities of Daily Living      Permission Sought/Granted                  Emotional Assessment              Admission diagnosis:  Metabolic encephalopathy [G93.41] Dehydration [E86.0] Acute hyponatremia [E87.1] Hypokalemia [E87.6] Lactic acidosis [E87.20] Hyponatremia [E87.1] Elevated liver enzymes [R74.8] Altered mental status, unspecified altered mental status type [R41.82] Nausea and vomiting, unspecified vomiting type [R11.2] Patient Active Problem List   Diagnosis Date Noted   Metabolic encephalopathy 07/31/2022   Hyponatremia 07/30/2022   Encephalopathy acute 07/30/2022   Delirium tremens 07/30/2022   Hypokalemia 07/30/2022   AKI (acute kidney injury) 07/30/2022   PCP:  Patient, No Pcp Per Pharmacy:   [facility] - [place] ([place]), [place] - [address] [place] ([place]) [place] [address] Phone: [phone] Fax: [phone]     Social Determinants of Health (SDOH) Social History:   SDOH Interventions:     Readmission Risk Interventions     No data to display

## 2022-08-02 NOTE — Progress Notes (Signed)
   08/02/22 1600  Spiritual Encounters  Type of Visit Initial  Care provided to: Patient  Referral source Chaplain assessment  Reason for visit Routine spiritual support  OnCall Visit No  Spiritual Framework  Presenting Themes Meaning/purpose/sources of inspiration  Community/Connection Family  Patient Stress Factors Health changes  Interventions  Spiritual Care Interventions Made Compassionate presence;Reflective listening;Normalization of emotions   Ch met patient while rounding on the floor. Ch established rapport and connectedness and facilitated communication between family member and patient. No follow-up needed at this time.

## 2022-08-02 NOTE — Plan of Care (Signed)

## 2022-08-02 NOTE — Progress Notes (Signed)
PROGRESS NOTE    Antonio Villegas  ZOX:[id number] DOB: 1961/04/07 DOA: 07/30/2022 PCP: Patient, No Pcp Per    Brief Narrative:  62 year old gentleman with alcoholism last consume alcohol on 419 presented to the ER on 4/20 with altered mental status.  Found by significant other naked confused shaking on the floor.  Nausea vomiting for last few days.  Patient was found tremulous, tachycardic.  Labs with sodium 118, potassium 2.8.  Admitted to ICU with severe alcohol withdrawal, hyponatremia metabolic alkalosis.  Treated with Precedex infusion.  Started on Ativan and transferred to medical floor on 4/23.  He was also noted to have positive C. Difficile toxin.   Assessment & Plan:   Acute metabolic encephalopathy, delirium tremens and alcohol withdrawal syndrome: Treated symptomatically.  Still remains symptomatic with occasional confusion.  On Ativan as needed.  Fall precautions.  Delirium precautions.  Multivitamin.  Continue counseling.   Hypochloremic hyponatremia, severe hypokalemia: Due to nausea vomiting.  Presented with hypovolemia.  Treated with isotonic fluid and already improving. Encourage oral fluid.  Discontinue IV fluid. Replace potassium aggressively, magnesium and phosphorus are adequate.  C. difficile test positive: CT scan abdomen pelvis without any acute process.  No more diarrhea.  No indication to treat now.   Left great toe pain and swelling: Left great toe pain and swelling: Patient has gouty arthritis.  1 dose of colchicine today.  Will check uric acid levels with morning labs.  Mobilize in the hallway.  Monitor today.  Anticipate home tomorrow if remains stable.   DVT prophylaxis: heparin injection 5,000 Units Start: 07/30/22 1400 SCDs Start: 07/30/22 1053   Code Status: Full code Family Communication: None at the bedside Disposition Plan: Status is: Inpatient Remains inpatient appropriate because: Alcohol withdrawal syndrome     Consultants:  Critical  care  Procedures:  None  Antimicrobials:  None   Subjective: Patient seen and examined.  Denies any complaints.  Slightly difficult to understand, however he tells me that he lives at home by himself.  He denies any nausea vomiting.  He does have some right great toe pain today and he gets this often.  Objective: Vitals:   08/02/22 0530 08/02/22 0600 08/02/22 0630 08/02/22 0700  BP: 96/61 111/80 127/89 123/87  Pulse: (!) 102 92 97 88  Resp: (!) 28 18 (!) 24 19  Temp:      TempSrc:      SpO2: 96% 97% 97% 98%  Weight:      Height:        Intake/Output Summary (Last 24 hours) at 08/02/2022 0751 Last data filed at 08/02/2022 0600 Gross per 24 hour  Intake 903.42 ml  Output 1275 ml  Net -371.58 ml   Filed Weights   07/30/22 1155 07/31/22 0333 08/01/22 0439  Weight: 74.7 kg 72.6 kg 71.7 kg    Examination:  General exam: Appears calm and comfortable  Alert awake and oriented.  Flat affect.  Not in any distress. Respiratory system: Clear to auscultation. Respiratory effort normal.  On room air. Cardiovascular system: S1 & S2 heard, RRR. No pedal edema. Gastrointestinal system: Soft and nontender.   Extremities: Symmetric 5 x 5 power. Skin:  Right first metatarsophalangeal joint with chronic deformity and swelling, minimal tenderness.  No erythema or redness.    Data Reviewed: I have personally reviewed following labs and imaging studies  CBC: Recent Labs  Lab 07/30/22 0830 07/30/22 1102 08/01/22 0301 08/02/22 0433  WBC 8.4 7.7 5.8 6.4  NEUTROABS 6.4  --  3.9  --   HGB 15.1 14.0 13.4 13.7  HCT 42.6 40.5 40.5 39.9  MCV 85.9 86.9 91.0 88.7  PLT 203 200 161 196   Basic Metabolic Panel: Recent Labs  Lab 07/30/22 0830 07/30/22 1102 07/30/22 1426 07/30/22 1844 07/31/22 0255 07/31/22 1447 07/31/22 2124 08/01/22 0301 08/01/22 1211 08/01/22 1933 08/02/22 0433  NA 120*  118* 124* 126*   < > 132*  131*   < > 132* 131* 132* 133* 131*  K 2.5*  2.8*  --   3.4*  --  3.1*  --   --  2.7*  --   --  3.1*  CL 71*  65*  --  79*  --  85*  --   --  88*  --   --  95*  CO2 31  33*  --  30  --  27  --   --  28  --   --  25  GLUCOSE 103*  107*  --  94  --  93  --   --  102*  --   --  122*  BUN 9  12  --  10  --  8  --   --  10  --   --  16  CREATININE 1.22  1.39* 1.19 1.15  --  1.04  --   --  0.90  --   --  1.00  CALCIUM 9.3  9.3  --  9.2  --  8.7*  --   --  8.4*  --   --  8.3*  MG  --  1.8  --   --  2.3  --   --  2.2  --   --  2.0  PHOS  --   --   --   --   --   --   --  2.5  --   --   --    < > = values in this interval not displayed.   GFR: Estimated Creatinine Clearance: 71.6 mL/min (by C-G formula based on SCr of 1 mg/dL). Liver Function Tests: Recent Labs  Lab 07/30/22 0830  AST 161*  ALT 62*  ALKPHOS 59  BILITOT 1.9*  PROT 7.3  ALBUMIN 4.0   Recent Labs  Lab 07/30/22 0830  LIPASE 40   No results for input(s): "AMMONIA" in the last 168 hours. Coagulation Profile: Recent Labs  Lab 07/30/22 0830  INR 0.9   Cardiac Enzymes: No results for input(s): "CKTOTAL", "CKMB", "CKMBINDEX", "TROPONINI" in the last 168 hours. BNP (last 3 results) No results for input(s): "PROBNP" in the last 8760 hours. HbA1C: No results for input(s): "HGBA1C" in the last 72 hours. CBG: Recent Labs  Lab 07/30/22 0851 07/30/22 1156 08/01/22 1956 08/01/22 2351  GLUCAP 140* 121* 130* 138*   Lipid Profile: No results for input(s): "CHOL", "HDL", "LDLCALC", "TRIG", "CHOLHDL", "LDLDIRECT" in the last 72 hours. Thyroid Function Tests: No results for input(s): "TSH", "T4TOTAL", "FREET4", "T3FREE", "THYROIDAB" in the last 72 hours. Anemia Panel: No results for input(s): "VITAMINB12", "FOLATE", "FERRITIN", "TIBC", "IRON", "RETICCTPCT" in the last 72 hours. Sepsis Labs: Recent Labs  Lab 07/30/22 0909 07/30/22 1038  LATICACIDVEN 2.2* 1.6    Recent Results (from the past 240 hour(s))  SARS Coronavirus 2 by RT PCR (hospital order, performed in [facility] hospital lab) *cepheid single result test* Anterior Nasal Swab     Status: None   Collection Time: 07/30/22  9:01 AM   Specimen: Anterior Nasal Swab  Result Value Ref Range Status   SARS Coronavirus 2 by RT PCR NEGATIVE NEGATIVE Final    Comment: Performed at [facility], [address], [place], [place] [address]  Blood Culture (routine x 2)     Status: None (Preliminary result)   Collection Time: 07/30/22  9:09 AM   Specimen: BLOOD RIGHT ARM  Result Value Ref Range Status   Specimen Description BLOOD RIGHT ARM  Final   Special Requests   Final    BOTTLES DRAWN AEROBIC AND ANAEROBIC Blood Culture adequate volume   Culture   Final    NO GROWTH 3 DAYS Performed at [facility], [address] [place], [place] [address]    Report Status PENDING  Incomplete  Blood Culture (routine x 2)     Status: None (Preliminary result)   Collection Time: 07/30/22  9:10 AM   Specimen: BLOOD LEFT HAND  Result Value Ref Range Status   Specimen Description BLOOD LEFT HAND  Final   Special Requests   Final    BOTTLES DRAWN AEROBIC AND ANAEROBIC Blood Culture adequate volume   Culture   Final    NO GROWTH 3 DAYS Performed at [facility], [address] [place], [place] [address]    Report Status PENDING  Incomplete  MRSA Next Gen by PCR, Nasal     Status: None   Collection Time: 07/30/22 12:02 PM   Specimen: Nasal Mucosa; Nasal Swab  Result Value Ref Range Status   MRSA by PCR Next Gen NOT DETECTED NOT DETECTED Final    Comment: (NOTE) The GeneXpert MRSA Assay (FDA approved for NASAL specimens only), is one component of a comprehensive MRSA colonization surveillance program. It is not intended to diagnose MRSA infection nor to guide or monitor treatment for MRSA infections. Test performance is not FDA approved in patients less than 22 years old. Performed at [facility], [address], [place], [place] [address]   C Difficile Quick Screen w PCR reflex      Status: Abnormal   Collection Time: 07/31/22  2:27 PM   Specimen: STOOL  Result Value Ref Range Status   C Diff antigen POSITIVE (A) NEGATIVE Final   C Diff toxin NEGATIVE NEGATIVE Final   C Diff interpretation Results are indeterminate. See PCR results.  Final    Comment: Performed at [facility], [address], [place], [place] [address]  C. Diff by PCR, Reflexed     Status: Abnormal   Collection Time: 07/31/22  2:27 PM  Result Value Ref Range Status   Toxigenic C. Difficile by PCR POSITIVE (A) NEGATIVE Final    Comment: Positive for toxigenic C. difficile with little to no toxin production. Only treat if clinical presentation suggests symptomatic illness. Performed at [facility], [address], [place], [place] [address]          Radiology Studies: No results found.      Scheduled Meds:  Chlorhexidine Gluconate Cloth  6 each Topical Daily   folic acid  1 mg Oral Daily   heparin  5,000 Units Subcutaneous Q8H   multivitamin with minerals  1 tablet Oral Daily   nicotine  14 mg Transdermal Daily   potassium chloride  60 mEq Oral BID   thiamine  100 mg Oral Daily   Or   thiamine  100 mg Intravenous Daily   Continuous Infusions:   LOS: 3 days    Time spent: 40  minutes    Barb Merino, MD [facility] Pager [phone]

## 2022-08-02 NOTE — Progress Notes (Signed)
Post fall check list - 08/01/2022 2200

## 2022-08-03 ENCOUNTER — Other Ambulatory Visit (HOSPITAL_COMMUNITY): Payer: Self-pay

## 2022-08-03 LAB — COMPREHENSIVE METABOLIC PANEL
ALT: 47 U/L — ABNORMAL HIGH (ref 0–44)
AST: 45 U/L — ABNORMAL HIGH (ref 15–41)
Albumin: 3.1 g/dL — ABNORMAL LOW (ref 3.5–5.0)
Alkaline Phosphatase: 47 U/L (ref 38–126)
Anion gap: 9 (ref 5–15)
BUN: 18 mg/dL (ref 8–23)
CO2: 22 mmol/L (ref 22–32)
Calcium: 8.1 mg/dL — ABNORMAL LOW (ref 8.9–10.3)
Chloride: 101 mmol/L (ref 98–111)
Creatinine, Ser: 1.15 mg/dL (ref 0.61–1.24)
GFR, Estimated: >60 mL/min (ref >60)
Glucose, Bld: 116 mg/dL — ABNORMAL HIGH (ref 70–99)
Potassium: 4.2 mmol/L (ref 3.5–5.1)
Sodium: 132 mmol/L — ABNORMAL LOW (ref 135–145)
Total Bilirubin: 0.9 mg/dL (ref 0.3–1.2)
Total Protein: 7 g/dL (ref 6.5–8.1)

## 2022-08-03 LAB — CBC WITH DIFFERENTIAL/PLATELET
Abs Immature Granulocytes: 0.05 10*3/uL (ref 0.00–0.07)
Basophils Absolute: 0 10*3/uL (ref 0.0–0.1)
Basophils Relative: 1 %
Eosinophils Absolute: 0.1 10*3/uL (ref 0.0–0.5)
Eosinophils Relative: 2 %
HCT: 40 % (ref 39.0–52.0)
Hemoglobin: 13.6 g/dL (ref 13.0–17.0)
Immature Granulocytes: 1 %
Lymphocytes Relative: 23 %
Lymphs Abs: 1.4 10*3/uL (ref 0.7–4.0)
MCH: 30.6 pg (ref 26.0–34.0)
MCHC: 34 g/dL (ref 30.0–36.0)
MCV: 89.9 fL (ref 80.0–100.0)
Monocytes Absolute: 1.1 10*3/uL — ABNORMAL HIGH (ref 0.1–1.0)
Monocytes Relative: 18 %
Neutro Abs: 3.5 10*3/uL (ref 1.7–7.7)
Neutrophils Relative %: 55 %
Platelets: 197 10*3/uL (ref 150–400)
RBC: 4.45 MIL/uL (ref 4.22–5.81)
RDW: 12.2 % (ref 11.5–15.5)
WBC: 6.1 10*3/uL (ref 4.0–10.5)
nRBC: 0 % (ref 0.0–0.2)

## 2022-08-03 LAB — CULTURE, BLOOD (ROUTINE X 2): Culture: NO GROWTH

## 2022-08-03 LAB — MAGNESIUM: Magnesium: 2.1 mg/dL (ref 1.7–2.4)

## 2022-08-03 LAB — PHOSPHORUS: Phosphorus: 2.4 mg/dL — ABNORMAL LOW (ref 2.5–4.6)

## 2022-08-03 MED ORDER — NICOTINE 14 MG/24HR TD PT24: 14.0000 mg | MEDICATED_PATCH | Freq: Every day | TRANSDERMAL | 0 refills | Status: DC

## 2022-08-03 MED ORDER — THIAMINE HCL 100 MG PO TABS
100.0000 mg | ORAL_TABLET | Freq: Every day | ORAL | 0 refills | Status: AC
  Filled 2022-08-03: qty 30, 30d supply, fill #0

## 2022-08-03 MED ORDER — VITAMIN B-1 100 MG PO TABS: 100.0000 mg | ORAL_TABLET | Freq: Every day | ORAL | 0 refills | Status: DC

## 2022-08-03 MED ORDER — POTASSIUM & SODIUM PHOSPHATES 280-160-250 MG PO PACK: 2.0000 | PACK | Freq: Three times a day (TID) | ORAL | 0 refills | Status: DC

## 2022-08-03 MED ORDER — FOLIC ACID 1 MG PO TABS
1.0000 mg | ORAL_TABLET | Freq: Every day | ORAL | 0 refills | Status: AC
  Filled 2022-08-03: qty 30, 30d supply, fill #0

## 2022-08-03 MED ORDER — FOLIC ACID 1 MG PO TABS: 1.0000 mg | ORAL_TABLET | Freq: Every day | ORAL | 0 refills | Status: DC

## 2022-08-03 MED ORDER — GUAIFENESIN-DM 100-10 MG/5ML PO SYRP
10.0000 mL | ORAL_SOLUTION | Freq: Four times a day (QID) | ORAL | 0 refills | Status: DC | PRN
  Filled 2022-08-03: qty 118, 3d supply, fill #0

## 2022-08-03 MED ORDER — ADULT MULTIVITAMIN W/MINERALS CH: 1.0000 | ORAL_TABLET | Freq: Every day | ORAL | 0 refills | Status: AC

## 2022-08-03 MED ORDER — NICOTINE 14 MG/24HR TD PT24
14.0000 mg | MEDICATED_PATCH | Freq: Every day | TRANSDERMAL | 0 refills | Status: DC
  Filled 2022-08-03: qty 28, 28d supply, fill #0

## 2022-08-03 MED ORDER — ADULT MULTIVITAMIN W/MINERALS CH: 1.0000 | ORAL_TABLET | Freq: Every day | ORAL | 0 refills | Status: DC

## 2022-08-03 MED ORDER — POTASSIUM & SODIUM PHOSPHATES 280-160-250 MG PO PACK
2.0000 | PACK | Freq: Three times a day (TID) | ORAL | Status: DC
  Administered 2022-08-03 (×2): 2 via ORAL
  Filled 2022-08-03 (×2): qty 2

## 2022-08-03 MED ORDER — IBUPROFEN 200 MG PO TABS
400.0000 mg | ORAL_TABLET | ORAL | Status: DC | PRN
  Administered 2022-08-03: 400 mg via ORAL
  Filled 2022-08-03: qty 2

## 2022-08-03 MED ORDER — POTASSIUM & SODIUM PHOSPHATES 280-160-250 MG PO PACK
2.0000 | PACK | Freq: Three times a day (TID) | ORAL | 0 refills | Status: AC
  Filled 2022-08-03: qty 24, 3d supply, fill #0

## 2022-08-03 NOTE — Discharge Summary (Signed)
Physician Discharge Summary  Antonio Villegas:[id number] DOB: September 22, 1960 DOA: 07/30/2022  PCP: Patient, No Pcp Per  Admit date: 07/30/2022 Discharge date: 08/03/2022  Admitted From: Home Disposition: Home, boarding home.  Recommendations for Outpatient Follow-up:  Follow up with PCP in 1-2 weeks Please obtain BMP/CBC/magnesium/phosphorus in one week   Home Health: N/A Equipment/Devices: N/A  Discharge Condition: Stable CODE STATUS: Full code Diet recommendation: Regular diet, nutritional supplements.  Avoid alcohol.  Stop smoking.  Discharge summary: 62 year old gentleman with alcoholism last consumed alcohol on 4/19 presented to the ER on 4/20 with altered mental status.  Found by significant other naked confused shaking on the floor.  Nausea vomiting for last few days.  Patient was found tremulous, tachycardic.  Labs with sodium 118, potassium 2.8.  Admitted to ICU with severe alcohol withdrawal, hyponatremia metabolic alkalosis.  Treated with Precedex infusion.  Started on Ativan and transferred to medical floor on 4/23.  He was also noted to have positive C. Difficile toxin. Currently stabilized.  Improved alcohol withdrawal symptoms.  Going home today.   Acute metabolic encephalopathy, delirium tremens and alcohol withdrawal syndrome: Treated symptomatically.  Currently improved.  Did not use any benzodiazepine for last 24 hours.   Extensive counseling.  Resources provided.     Hypochloremic hyponatremia, severe hypokalemia: Due to nausea vomiting.  Presented with hypovolemia.  Treated with isotonic fluid and already improving. Currently maintaining nutrition, electrolytes.  Sodium has improved.  His phosphorus was slightly low, will prescribe 3 days of replacement.   C. difficile test positive: CT scan abdomen pelvis without any acute process.  No more diarrhea.  No indication to treat now.    Left great toe pain and swelling: Probable gouty arthritis.  Improved.  Uric acid  was normal.  No indication for maintenance therapy.   Patient is stable.  High risk of readmission due to alcoholism.  Discharged home today.  He will continue multivitamin, thiamine and folic acid.  He will take over-the-counter cough medications and Tylenol less than 2 g a day.   Discharge Diagnoses:  Principal Problem:   Hyponatremia Active Problems:   Encephalopathy acute   Delirium tremens   Hypokalemia   AKI (acute kidney injury)   Metabolic encephalopathy    Discharge Instructions  Discharge Instructions     Diet general   Complete by: As directed    Increase activity slowly   Complete by: As directed       Allergies as of 08/03/2022   No Known Allergies      Medication List     TAKE these medications    folic acid 1 MG tablet Commonly known as: FOLVITE Take 1 tablet (1 mg total) by mouth daily. Start taking on: August 04, 2022   multivitamin with minerals Tabs tablet Take 1 tablet by mouth daily. Start taking on: August 04, 2022   nicotine 14 mg/24hr patch Commonly known as: NICODERM CQ - dosed in mg/24 hours Place 1 patch (14 mg total) onto the skin daily. Start taking on: August 04, 2022   potassium & sodium phosphates 280-160-250 MG Pack Commonly known as: PHOS-NAK Take 2 packets by mouth 4 (four) times daily -  before meals and at bedtime for 3 days.   thiamine 100 MG tablet Commonly known as: Vitamin B-1 Take 1 tablet (100 mg total) by mouth daily. Start taking on: August 04, 2022        No Known Allergies  Consultations: Critical care   Procedures/Studies: CT ABDOMEN PELVIS W  CONTRAST  Result Date: 07/30/2022 CLINICAL DATA:  Abdominal trauma, blunt. Abrasions to the chest and abdomen. Vomiting for 2 days. EXAM: CT ABDOMEN AND PELVIS WITH CONTRAST TECHNIQUE: Multidetector CT imaging of the abdomen and pelvis was performed using the standard protocol following bolus administration of intravenous contrast. RADIATION DOSE REDUCTION: This  exam was performed according to the departmental dose-optimization program which includes automated exposure control, adjustment of the mA and/or kV according to patient size and/or use of iterative reconstruction technique. CONTRAST:  75mL OMNIPAQUE IOHEXOL 350 MG/ML SOLN COMPARISON:  None FINDINGS: Lower chest: The lung bases are clear without focal nodule, mass, or airspace disease. The heart size is normal. No significant pleural or pericardial effusion is present. Hepatobiliary: No focal liver abnormality is seen. No gallstones, gallbladder wall thickening, or biliary dilatation. Pancreas: Unremarkable. No pancreatic ductal dilatation or surrounding inflammatory changes. Spleen: Normal in size without focal abnormality. Adrenals/Urinary Tract: Adrenal glands are unremarkable. Kidneys are normal, without renal calculi, focal lesion, or hydronephrosis. Bladder is unremarkable. Stomach/Bowel: A small hiatal hernia is present. The stomach and duodenum within normal limits. Small bowel is unremarkable. Terminal ileum is within normal limits. The appendix is visualized and normal. The ascending and transverse colon are normal. Descending and sigmoid colon are normal. Rectum is unremarkable. Vascular/Lymphatic: Atherosclerotic calcifications are present in the aorta and branch vessels. No aneurysm is present. No significant adenopathy is present. Reproductive: Prostate calcifications noted. Gland is within normal limits for size. Seminal vesicles are normal. Other: No abdominal wall hernia or abnormality. No abdominopelvic ascites. Musculoskeletal: A transitional S1 segment is present. Vertebral body heights and alignment are normal. No acute trauma is present. Bony pelvis is normal. Degenerative changes are present the lower lumbar spine and at the SI joints. Mild degenerative changes are present at both hips. IMPRESSION: 1. No acute or focal lesion to explain the patient's symptoms. No acute trauma. 2. Small hiatal  hernia. 3. Degenerative changes of the lower lumbar spine and hip joints. 4.  Aortic Atherosclerosis (ICD10-I70.0). Electronically Signed   By: Marin Roberts M.D.   On: 07/30/2022 10:36   CT Head Wo Contrast  Result Date: 07/30/2022 CLINICAL DATA:  62 year old male with vomiting. Possible head trauma. EXAM: CT HEAD WITHOUT CONTRAST TECHNIQUE: Contiguous axial images were obtained from the base of the skull through the vertex without intravenous contrast. RADIATION DOSE REDUCTION: This exam was performed according to the departmental dose-optimization program which includes automated exposure control, adjustment of the mA and/or kV according to patient size and/or use of iterative reconstruction technique. COMPARISON:  None Available. FINDINGS: Brain: Mild motion artifact. Cerebral volume is within normal limits for age. No midline shift, ventriculomegaly, mass effect, evidence of mass lesion, intracranial hemorrhage or evidence of cortically based acute infarction. Grossly normal gray-white differentiation throughout. Vascular: Calcified atherosclerosis at the skull base. No suspicious intracranial vascular hyperdensity. Skull: Intermittent motion artifact. No acute osseous abnormality identified. Sinuses/Orbits: Tympanic cavities and mastoids appear well aerated. Left maxillary mucoperiosteal thickening, total opacification. Only mild contralateral right maxillary mucosal thickening and other paranasal sinuses are well aerated. Other: No acute orbit or scalp soft tissue finding. IMPRESSION: 1. Motion artifact. No acute intracranial abnormality or acute traumatic injury identified. 2. Chronic left maxillary sinusitis. Electronically Signed   By: Odessa Fleming M.D.   On: 07/30/2022 10:28   DG Chest Port 1 View  Result Date: 07/30/2022 CLINICAL DATA:  Sepsis EXAM: PORTABLE CHEST 1 VIEW COMPARISON:  None Available. FINDINGS: The heart size and mediastinal contours are within  normal limits. Both lungs are  clear. The visualized skeletal structures are unremarkable. IMPRESSION: No active disease. Electronically Signed   By: Duanne Guess D.O.   On: 07/30/2022 09:36   (Echo, Carotid, EGD, Colonoscopy, ERCP)    Subjective: Patient seen and examined.  Denies any complaints.  Denies any nausea vomiting or diarrhea.  He is working out place to go after discharge.   Discharge Exam: Vitals:   08/03/22 0732 08/03/22 0800  BP:  (!) 135/114  Pulse:  96  Resp:  18  Temp: 98.3 F (36.8 C)   SpO2:  100%   Vitals:   08/03/22 0600 08/03/22 0700 08/03/22 0732 08/03/22 0800  BP: 127/89 (!) 133/92  (!) 135/114  Pulse: 87 85  96  Resp: Temp:   98.3 F (36.8 C)   TempSrc:   Oral   SpO2: 100% 100%  100%  Weight:      Height:        General: Pt is alert, awake, not in acute distress Flat affect. Cardiovascular: RRR, S1/S2 +, no rubs, no gallops Respiratory: CTA bilaterally, no wheezing, no rhonchi Abdominal: Soft, NT, ND, bowel sounds + Extremities: no edema, no cyanosis    The results of significant diagnostics from this hospitalization (including imaging, microbiology, ancillary and laboratory) are listed below for reference.     Microbiology: Recent Results (from the past 240 hour(s))  SARS Coronavirus 2 by RT PCR (hospital order, performed in [facility] hospital lab) *cepheid single result test* Anterior Nasal Swab     Status: None   Collection Time: 07/30/22  9:01 AM   Specimen: Anterior Nasal Swab  Result Value Ref Range Status   SARS Coronavirus 2 by RT PCR NEGATIVE NEGATIVE Final    Comment: Performed at [facility], [address], [place], [place] [address]  Blood Culture (routine x 2)     Status: None (Preliminary result)   Collection Time: 07/30/22  9:09 AM   Specimen: BLOOD RIGHT ARM  Result Value Ref Range Status   Specimen Description BLOOD RIGHT ARM  Final   Special Requests   Final    BOTTLES DRAWN AEROBIC AND ANAEROBIC Blood Culture adequate  volume   Culture   Final    NO GROWTH 4 DAYS Performed at [facility], [address] [place], [place] [address]    Report Status PENDING  Incomplete  Blood Culture (routine x 2)     Status: None (Preliminary result)   Collection Time: 07/30/22  9:10 AM   Specimen: BLOOD LEFT HAND  Result Value Ref Range Status   Specimen Description BLOOD LEFT HAND  Final   Special Requests   Final    BOTTLES DRAWN AEROBIC AND ANAEROBIC Blood Culture adequate volume   Culture   Final    NO GROWTH 4 DAYS Performed at [facility], [address] [place], [place] [address]    Report Status PENDING  Incomplete  MRSA Next Gen by PCR, Nasal     Status: None   Collection Time: 07/30/22 12:02 PM   Specimen: Nasal Mucosa; Nasal Swab  Result Value Ref Range Status   MRSA by PCR Next Gen NOT DETECTED NOT DETECTED Final    Comment: (NOTE) The GeneXpert MRSA Assay (FDA approved for NASAL specimens only), is one component of a comprehensive MRSA colonization surveillance program. It is not intended to diagnose MRSA infection nor to guide or monitor treatment for MRSA infections. Test  performance is not FDA approved in patients less than 72 years old. Performed at [facility], [address], [place], [place] [address]   C Difficile Quick Screen w PCR reflex     Status: Abnormal   Collection Time: 07/31/22  2:27 PM   Specimen: STOOL  Result Value Ref Range Status   C Diff antigen POSITIVE (A) NEGATIVE Final   C Diff toxin NEGATIVE NEGATIVE Final   C Diff interpretation Results are indeterminate. See PCR results.  Final    Comment: Performed at [facility], [address], [place], [place] [address]  C. Diff by PCR, Reflexed     Status: Abnormal   Collection Time: 07/31/22  2:27 PM  Result Value Ref Range Status   Toxigenic C. Difficile by PCR POSITIVE (A) NEGATIVE Final    Comment: Positive for toxigenic C. difficile with little to no toxin production. Only treat if  clinical presentation suggests symptomatic illness. Performed at [facility], [address], [place], [place] [address]      Labs: BNP (last 3 results) No results for input(s): "BNP" in the last 8760 hours. Basic Metabolic Panel: Recent Labs  Lab 07/30/22 1102 07/30/22 1426 07/30/22 1844 07/31/22 0255 07/31/22 1447 08/01/22 0301 08/01/22 1211 08/01/22 1933 08/02/22 0433 08/02/22 1136 08/03/22 0212  NA 124* 126*   < > 132*  131*   < > 131* 132* 133* 131* 136 132*  K  --  3.4*  --  3.1*  --  2.7*  --   --  3.1*  --  4.2  CL  --  79*  --  85*  --  88*  --   --  95*  --  101  CO2  --  30  --  27  --  28  --   --  25  --  22  GLUCOSE  --  94  --  93  --  102*  --   --  122*  --  116*  BUN  --  10  --  8  --  10  --   --  16  --  18  CREATININE 1.19 1.15  --  1.04  --  0.90  --   --  1.00  --  1.15  CALCIUM  --  9.2  --  8.7*  --  8.4*  --   --  8.3*  --  8.1*  MG 1.8  --   --  2.3  --  2.2  --   --  2.0  --  2.1  PHOS  --   --   --   --   --  2.5  --   --   --   --  2.4*   < > = values in this interval not displayed.   Liver Function Tests: Recent Labs  Lab 07/30/22 0830 08/03/22 0212  AST 161* 45*  ALT 62* 47*  ALKPHOS 59 47  BILITOT 1.9* 0.9  PROT 7.3 7.0  ALBUMIN 4.0 3.1*   Recent Labs  Lab 07/30/22 0830  LIPASE 40   No results for input(s): "AMMONIA" in the last 168 hours. CBC: Recent Labs  Lab 07/30/22 0830 07/30/22 1102 08/01/22 0301 08/02/22 0433 08/03/22 0212  WBC 8.4 7.7 5.8 6.4 6.1  NEUTROABS 6.4  --  3.9  --  3.5  HGB 15.1 14.0 13.4 13.7 13.6  HCT 42.6 40.5 40.5 39.9 40.0  MCV 85.9 86.9  91.0 88.7 89.9  PLT 203 200 161 196 197   Cardiac Enzymes: No results for input(s): "CKTOTAL", "CKMB", "CKMBINDEX", "TROPONINI" in the last 168 hours. BNP: Invalid input(s): "POCBNP" CBG: Recent Labs  Lab 08/01/22 1956 08/01/22 2351 08/02/22 0754 08/02/22 1159 08/02/22 1535  GLUCAP 130* 138* 109* 154* 137*   D-Dimer No results for  input(s): "DDIMER" in the last 72 hours. Hgb A1c No results for input(s): "HGBA1C" in the last 72 hours. Lipid Profile No results for input(s): "CHOL", "HDL", "LDLCALC", "TRIG", "CHOLHDL", "LDLDIRECT" in the last 72 hours. Thyroid function studies No results for input(s): "TSH", "T4TOTAL", "T3FREE", "THYROIDAB" in the last 72 hours.  Invalid input(s): "FREET3" Anemia work up No results for input(s): "VITAMINB12", "FOLATE", "FERRITIN", "TIBC", "IRON", "RETICCTPCT" in the last 72 hours. Urinalysis    Component Value Date/Time   COLORURINE YELLOW 07/30/2022 0830   APPEARANCEUR CLEAR 07/30/2022 0830   LABSPEC 1.004 (L) 07/30/2022 0830   PHURINE 7.0 07/30/2022 0830   GLUCOSEU NEGATIVE 07/30/2022 0830   HGBUR MODERATE (A) 07/30/2022 0830   BILIRUBINUR NEGATIVE 07/30/2022 0830   KETONESUR NEGATIVE 07/30/2022 0830   PROTEINUR NEGATIVE 07/30/2022 0830   NITRITE NEGATIVE 07/30/2022 0830   LEUKOCYTESUR NEGATIVE 07/30/2022 0830   Sepsis Labs Recent Labs  Lab 07/30/22 1102 08/01/22 0301 08/02/22 0433 08/03/22 0212  WBC 7.7 5.8 6.4 6.1   Microbiology Recent Results (from the past 240 hour(s))  SARS Coronavirus 2 by RT PCR (hospital order, performed in [facility] hospital lab) *cepheid single result test* Anterior Nasal Swab     Status: None   Collection Time: 07/30/22  9:01 AM   Specimen: Anterior Nasal Swab  Result Value Ref Range Status   SARS Coronavirus 2 by RT PCR NEGATIVE NEGATIVE Final    Comment: Performed at [facility], [address], [place], [place] [address]  Blood Culture (routine x 2)     Status: None (Preliminary result)   Collection Time: 07/30/22  9:09 AM   Specimen: BLOOD RIGHT ARM  Result Value Ref Range Status   Specimen Description BLOOD RIGHT ARM  Final   Special Requests   Final    BOTTLES DRAWN AEROBIC AND ANAEROBIC Blood Culture adequate volume   Culture   Final    NO GROWTH 4 DAYS Performed at [facility], [address],  [place], [place] [address]    Report Status PENDING  Incomplete  Blood Culture (routine x 2)     Status: None (Preliminary result)   Collection Time: 07/30/22  9:10 AM   Specimen: BLOOD LEFT HAND  Result Value Ref Range Status   Specimen Description BLOOD LEFT HAND  Final   Special Requests   Final    BOTTLES DRAWN AEROBIC AND ANAEROBIC Blood Culture adequate volume   Culture   Final    NO GROWTH 4 DAYS Performed at [facility], [address], [place], [place] [address]    Report Status PENDING  Incomplete  MRSA Next Gen by PCR, Nasal     Status: None   Collection Time: 07/30/22 12:02 PM   Specimen: Nasal Mucosa; Nasal Swab  Result Value Ref Range Status   MRSA by PCR Next Gen NOT DETECTED NOT DETECTED Final    Comment: (NOTE) The GeneXpert MRSA Assay (FDA approved for NASAL specimens only), is one component of a comprehensive MRSA colonization surveillance program. It is not intended to diagnose MRSA infection nor to guide or monitor treatment for MRSA infections. Test performance is  not FDA approved in patients less than 5 years old. Performed at [facility], [address], [place], [place] [address]   C Difficile Quick Screen w PCR reflex     Status: Abnormal   Collection Time: 07/31/22  2:27 PM   Specimen: STOOL  Result Value Ref Range Status   C Diff antigen POSITIVE (A) NEGATIVE Final   C Diff toxin NEGATIVE NEGATIVE Final   C Diff interpretation Results are indeterminate. See PCR results.  Final    Comment: Performed at [facility], [address] [place], [place] [address]  C. Diff by PCR, Reflexed     Status: Abnormal   Collection Time: 07/31/22  2:27 PM  Result Value Ref Range Status   Toxigenic C. Difficile by PCR POSITIVE (A) NEGATIVE Final    Comment: Positive for toxigenic C. difficile with little to no toxin production. Only treat if clinical presentation suggests symptomatic illness. Performed at [facility], [address],  [place], [place] [address]      Time coordinating discharge: 40 minutes  SIGNED:   Dorcas Carrow, MD  [facility] 08/03/2022, 11:13 AM

## 2022-08-03 NOTE — Evaluation (Signed)
Physical Therapy Evaluation Patient Details Name: Antonio Villegas MRN: [id number] DOB: 1961/04/03 Today's Date: 08/03/2022  History of Present Illness  Patient is 62 y.o. male presented to the ER on 4/20 with AMS. Pt found by significant other naked confused shaking on the floor, pt has a history of alcoholism and last consumed alcohol on 4/19. Pt admitted to ICU with severe alcohol withdrawal, hyponatremia metabolic alkalosis.   Clinical Impression  Antonio Villegas is 63 y.o. male admitted with above HPI and diagnosis. Patient is currently limited by functional impairments below (see PT problem list). Patient lives with his girlfriend but has unsure housing available at this time, per pt he is independent with no AD for mobility at baseline. Currently he requires min assist for gait with RW for support to stabilize balance. Patient will benefit from continued skilled PT interventions to address impairments and progress independence with mobility, recommending follow up therapy for balance training and RW for safety with gait. Acute PT will follow and progress as able.        Recommendations for follow up therapy are one component of a multi-disciplinary discharge planning process, led by the attending physician.  Recommendations may be updated based on patient status, additional functional criteria and insurance authorization.  Follow Up Recommendations       Assistance Recommended at Discharge Frequent or constant Supervision/Assistance  Patient can return home with the following  A little help with walking and/or transfers;A little help with bathing/dressing/bathroom;Assist for transportation;Help with stairs or ramp for entrance    Equipment Recommendations Rolling walker (2 wheels)  Recommendations for Other Services       Functional Status Assessment Patient has had a recent decline in their functional status and demonstrates the ability to make significant improvements in  function in a reasonable and predictable amount of time.     Precautions / Restrictions Precautions Precautions: Fall Restrictions Weight Bearing Restrictions: No      Mobility  Bed Mobility Overal bed mobility: Needs Assistance Bed Mobility: Supine to Sit, Sit to Supine     Supine to sit: Modified independent (Device/Increase time) Sit to supine: Modified independent (Device/Increase time)   General bed mobility comments: use of bed features (HOB elevated)    Transfers Overall transfer level: Needs assistance Equipment used: Rolling walker (2 wheels), None Transfers: Sit to/from Stand Sit to Stand: Min guard           General transfer comment: min guard for safety with sit<>stand from EOB, pt using bil UE to control rise and lower. 5x sit<>stand completed without UE use (14.3 seconds).    Ambulation/Gait Ambulation/Gait assistance: Min assist Gait Distance (Feet): 200 Feet (120, 80) Assistive device: Rolling walker (2 wheels), 1 person hand held assist Gait Pattern/deviations: Step-through pattern, Decreased stride length, Scissoring, Narrow base of support, Drifts right/left Gait velocity: decr     General Gait Details: pt has narrow BOS throughout with Rt LE adducting>Lt LE. pt completed bout with RW and min assist and bout with 1HHA on Rt. Pt has tendency to drift Rt due to Rt LE adducting creating narrow BOS during gait.  Stairs            Wheelchair Mobility    Modified Rankin (Stroke Patients Only)       Balance Overall balance assessment: Needs assistance Sitting-balance support: Feet supported Sitting balance-Leahy Scale: Good     Standing balance support: Reliant on assistive device for balance, During functional activity, Single extremity supported, Bilateral upper extremity supported  Standing balance-Leahy Scale: Fair Standing balance comment: reliant on external support                             Pertinent Vitals/Pain  Pain Assessment Pain Assessment: No/denies pain    Home Living Family/patient expects to be discharged to:: Unsure                   Additional Comments: pt is between housing, unsure who he will stay with or if he will need to DC ot [facility]    Prior Function Prior Level of Function : Independent/Modified Independent                     Hand Dominance   Dominant Hand: Right    Extremity/Trunk Assessment   Upper Extremity Assessment Upper Extremity Assessment: Overall WFL for tasks assessed    Lower Extremity Assessment Lower Extremity Assessment: Overall WFL for tasks assessed;RLE deficits/detail;LLE deficits/detail RLE Deficits / Details: 4/5 or better for LE strength with hip flex, knee ext/flex, DF/PF RLE Sensation: WNL RLE Coordination: WNL LLE Deficits / Details: 4/5 or better for LE strength with hip flex, knee ext/flex, DF/PF LLE Sensation: WNL LLE Coordination: WNL    Cervical / Trunk Assessment Cervical / Trunk Assessment: Normal  Communication   Communication: No difficulties  Cognition Arousal/Alertness: Awake/alert Behavior During Therapy: WFL for tasks assessed/performed Overall Cognitive Status: No family/caregiver present to determine baseline cognitive functioning Area of Impairment: Orientation, Memory, Following commands                 Orientation Level: Disoriented to, Situation   Memory: Decreased short-term memory Following Commands: Follows one step commands consistently, Follows multi-step commands inconsistently                General Comments      Exercises     Assessment/Plan    PT Assessment Patient needs continued PT services  PT Problem List Decreased strength;Decreased activity tolerance;Decreased balance;Decreased mobility;Decreased cognition;Decreased safety awareness;Decreased knowledge of use of DME;Decreased knowledge of precautions       PT Treatment Interventions DME instruction;Gait  training;Stair training;Functional mobility training;Therapeutic activities;Therapeutic exercise;Balance training;Neuromuscular re-education;Patient/family education;Cognitive remediation    PT Goals (Current goals can be found in the Care Plan section)  Acute Rehab PT Goals Patient Stated Goal: get bettter and back with family/girlfriend PT Goal Formulation: With patient Time For Goal Achievement: 08/17/22 Potential to Achieve Goals: Fair    Frequency Min 3X/week     Co-evaluation               AM-PAC PT "6 Clicks" Mobility  Outcome Measure Help needed turning from your back to your side while in a flat bed without using bedrails?: None Help needed moving from lying on your back to sitting on the side of a flat bed without using bedrails?: None Help needed moving to and from a bed to a chair (including a wheelchair)?: A Little Help needed standing up from a chair using your arms (e.g., wheelchair or bedside chair)?: A Little Help needed to walk in hospital room?: A Little Help needed climbing 3-5 steps with a railing? : A Lot 6 Click Score: 19    End of Session Equipment Utilized During Treatment: Gait belt Activity Tolerance: Patient tolerated treatment well Patient left: in bed;with call bell/phone within reach;with bed alarm set Nurse Communication: Mobility status PT Visit Diagnosis: Unsteadiness on feet (R26.81);Muscle weakness (generalized) (M62.81);Difficulty in  walking, not elsewhere classified (R26.2)    Time: 1610-9604 PT Time Calculation (min) (ACUTE ONLY): 23 min   Charges:   PT Evaluation $PT Eval Moderate Complexity: 1 Mod PT Treatments $Gait Training: 8-22 mins       Wynn Maudlin, DPT Acute Rehabilitation Services Office [phone]  08/03/22 4:39 PM

## 2022-08-03 NOTE — TOC Progression Note (Signed)
Transition of Care [facility]) - Initial/Assessment Note    Patient Details  Name: Antonio Villegas MRN: [id number] Date of Birth: Aug 01, 1960  [facility]) CM/SW Contact:    Ralene Bathe, LCSWA Phone Number: 08/03/2022, 12:22 PM  Clinical Narrative:                 LCSW received consult for housing and substance use.  LCSW met with the patient and patient' significant other at bedside.  Patient gave verbal permission for LCSW to discuss the above information with significant other present.  The patient reports that he recently moved into a boarding house that has bed bugs and will have to move out.  LCSW explained that if family is unable to assist the patient with housing, patient could be discharged to the [employer].  LCSW also informed the family of motels in the area that the family could contact.   LCSW completed substance use assessment with patient.  Patient opted for outpatient substance use treatment.  Both substance use and housing resources were given to patient.     Patient Goals and CMS Choice            Expected Discharge Plan and Services         Expected Discharge Date: 08/03/22                                    Prior Living Arrangements/Services                       Activities of Daily Living      Permission Sought/Granted                  Emotional Assessment              Admission diagnosis:  Metabolic encephalopathy [G93.41] Dehydration [E86.0] Acute hyponatremia [E87.1] Hypokalemia [E87.6] Lactic acidosis [E87.20] Hyponatremia [E87.1] Elevated liver enzymes [R74.8] Altered mental status, unspecified altered mental status type [R41.82] Nausea and vomiting, unspecified vomiting type [R11.2] Patient Active Problem List   Diagnosis Date Noted   Metabolic encephalopathy 07/31/2022   Hyponatremia 07/30/2022   Encephalopathy acute 07/30/2022   Delirium tremens 07/30/2022   Hypokalemia  07/30/2022   AKI (acute kidney injury) 07/30/2022   PCP:  Patient, No Pcp Per Pharmacy:   [id number] - [place] ([place]), [place] - [address] [place] [address] Phone: [phone] Fax: [phone]     Social Determinants of Health (SDOH) Social History:   SDOH Interventions:     Readmission Risk Interventions     No data to display

## 2022-08-03 NOTE — TOC Transition Note (Addendum)
Transition of Care [facility]) - CM/SW Discharge Note   Patient Details  Name: Antonio Villegas MRN: [id number] Date of Birth: September 25, 1960  [facility]) CM/SW Contact:  Tom-Johnson, Hershal Coria, RN Phone Number: 08/03/2022, 1:38 PM   Clinical Narrative:     Patient is scheduled for discharge today.  Readmission Prevention Assessment done. Discharge instructions on AVS.  Housing information given to patient by LCSW. MATCH done for prescription assistance as patient does not have insurance. Prescription sent to [facility] pharmacy and meds to be delivered to patient at bedside prior to discharge.  Significant other, Ava to transport at discharge.  No further TOC needs noted.   15:00- PT recommended RW. CM called in order to [employer] for a charity RW as patient does not currently has Insurance, Medicaid is pending at this time.  RW to be delivered to patient's room before leaving unit. No further TOC need noted.      Final next level of care: Home/Self Care Barriers to Discharge: Barriers Resolved   Patient Goals and CMS Choice CMS Medicare.gov Compare Post Acute Care list provided to:: Patient Choice offered to / list presented to : Patient  Discharge Placement                  Patient to be transferred to facility by: Significant other Name of family member notified: Brett Canales    Discharge Plan and Services Additional resources added to the After Visit Summary for                  DME Arranged: N/A DME Agency: NA       HH Arranged: NA HH Agency: NA        Social Determinants of Health (SDOH) Interventions     Readmission Risk Interventions    08/03/2022    1:35 PM  Readmission Risk Prevention Plan  Post Dischage Appt Complete  Medication Screening Complete  Transportation Screening Complete

## 2022-08-04 LAB — CULTURE, BLOOD (ROUTINE X 2): Special Requests: ADEQUATE

## 2022-10-03 ENCOUNTER — Emergency Department (HOSPITAL_COMMUNITY): Payer: Medicaid Other

## 2022-10-03 ENCOUNTER — Other Ambulatory Visit: Payer: Self-pay

## 2022-10-03 ENCOUNTER — Emergency Department (HOSPITAL_COMMUNITY)
Admission: EM | Admit: 2022-10-03 | Discharge: 2022-10-04 | Disposition: A | Discharge status: Home / Self Care | Payer: Medicaid Other | Attending: Emergency Medicine | Admitting: Emergency Medicine

## 2022-10-03 ENCOUNTER — Encounter (HOSPITAL_COMMUNITY): Payer: Self-pay | Admitting: *Deleted

## 2022-10-03 DIAGNOSIS — R066 Hiccough: Secondary | ICD-10-CM | POA: Diagnosis not present

## 2022-10-03 DIAGNOSIS — R202 Paresthesia of skin: Secondary | ICD-10-CM | POA: Insufficient documentation

## 2022-10-03 DIAGNOSIS — M47812 Spondylosis without myelopathy or radiculopathy, cervical region: Secondary | ICD-10-CM | POA: Diagnosis not present

## 2022-10-03 DIAGNOSIS — M542 Cervicalgia: Secondary | ICD-10-CM | POA: Diagnosis present

## 2022-10-03 DIAGNOSIS — K449 Diaphragmatic hernia without obstruction or gangrene: Secondary | ICD-10-CM | POA: Diagnosis not present

## 2022-10-03 DIAGNOSIS — R200 Anesthesia of skin: Secondary | ICD-10-CM

## 2022-10-03 HISTORY — DX: Metabolic encephalopathy: G93.41

## 2022-10-03 HISTORY — DX: Anemia, unspecified: D64.9

## 2022-10-03 LAB — CBC
HCT: 40.5 % (ref 39.0–52.0)
Hemoglobin: 12.8 g/dL — ABNORMAL LOW (ref 13.0–17.0)
MCH: 28.2 pg (ref 26.0–34.0)
MCHC: 31.6 g/dL (ref 30.0–36.0)
MCV: 89.2 fL (ref 80.0–100.0)
Platelets: 277 10*3/uL (ref 150–400)
RBC: 4.54 MIL/uL (ref 4.22–5.81)
RDW: 12.4 % (ref 11.5–15.5)
WBC: 8 10*3/uL (ref 4.0–10.5)
nRBC: 0 % (ref 0.0–0.2)

## 2022-10-03 LAB — MAGNESIUM: Magnesium: 1.8 mg/dL (ref 1.7–2.4)

## 2022-10-03 LAB — COMPREHENSIVE METABOLIC PANEL
ALT: 19 U/L (ref 0–44)
AST: 20 U/L (ref 15–41)
Albumin: 4.5 g/dL (ref 3.5–5.0)
Alkaline Phosphatase: 53 U/L (ref 38–126)
Anion gap: 12 (ref 5–15)
BUN: 15 mg/dL (ref 8–23)
CO2: 33 mmol/L — ABNORMAL HIGH (ref 22–32)
Calcium: 10.6 mg/dL — ABNORMAL HIGH (ref 8.9–10.3)
Chloride: 94 mmol/L — ABNORMAL LOW (ref 98–111)
Creatinine, Ser: 1.46 mg/dL — ABNORMAL HIGH (ref 0.61–1.24)
GFR, Estimated: 54 mL/min — ABNORMAL LOW (ref >60)
Glucose, Bld: 135 mg/dL — ABNORMAL HIGH (ref 70–99)
Potassium: 3.1 mmol/L — ABNORMAL LOW (ref 3.5–5.1)
Sodium: 139 mmol/L (ref 135–145)
Total Bilirubin: 1.3 mg/dL — ABNORMAL HIGH (ref 0.3–1.2)
Total Protein: 8 g/dL (ref 6.5–8.1)

## 2022-10-03 LAB — LIPASE, BLOOD: Lipase: 48 U/L (ref 11–51)

## 2022-10-03 LAB — TROPONIN I (HIGH SENSITIVITY): Troponin I (High Sensitivity): 9 ng/L (ref <18)

## 2022-10-03 MED ORDER — DIPHENHYDRAMINE HCL 50 MG/ML IJ SOLN
25.0000 mg | Freq: Once | INTRAMUSCULAR | Status: AC
  Administered 2022-10-04: 25 mg via INTRAVENOUS
  Filled 2022-10-03: qty 1

## 2022-10-03 MED ORDER — POTASSIUM CHLORIDE CRYS ER 20 MEQ PO TBCR
40.0000 meq | EXTENDED_RELEASE_TABLET | Freq: Once | ORAL | Status: AC
  Administered 2022-10-03: 40 meq via ORAL
  Filled 2022-10-03: qty 2

## 2022-10-03 MED ORDER — METOCLOPRAMIDE HCL 5 MG/ML IJ SOLN
10.0000 mg | Freq: Once | INTRAMUSCULAR | Status: AC
  Administered 2022-10-04: 10 mg via INTRAVENOUS
  Filled 2022-10-03: qty 2

## 2022-10-03 MED ORDER — LORAZEPAM 2 MG/ML IJ SOLN
0.5000 mg | Freq: Once | INTRAMUSCULAR | Status: DC
  Filled 2022-10-03: qty 1

## 2022-10-03 MED ORDER — SODIUM CHLORIDE 0.9 % IV SOLN: 12.5000 mg | Freq: Four times a day (QID) | INTRAVENOUS | Status: DC | PRN

## 2022-10-03 NOTE — Progress Notes (Signed)
 Antonio Villegas is a 62 y.o. male came in c/o headache, vomiting and finger numbness. Pt states symptoms started April 30th.

## 2022-10-03 NOTE — Progress Notes (Addendum)
 Subjective Patient ID: Antonio Villegas is a 62 y.o. male.  Patient here for evaluation of headaches and vomiting for the past 2 months after being discharge from the hospital.  He also reports chronic hiccups for 3 years.  Patient was in the hospital and was diagnosed with metabolic encephalopathy and had hyponatremia and altered mental status.  He states he has not had any follow ups since being discharged.  He reports daily headaches and up to 5 episodes of daily vomiting since discharge.  Patient reports heartburn daily but denies abdominal pain, chest pain, cough, or shortness of breath.  He denies any fever.  He reports he has gained about 10 pounds in the past 2 months.  Patient denies any blurred vision or light sensitivity.  Patient states he stopped drinking alcohol after being admitted to the hospital.  He states he does not take any medications other than OTC tylenol / ibuprofen .  He reports having chronic neck pain.  He also reports recently having tingling in the fingertips bilaterally.     History provided by:  Patient Language interpreter used: No     Review of Systems  Constitutional:  Negative for activity change, appetite change, chills, diaphoresis, fatigue, fever and unexpected weight change.  HENT:  Negative for congestion, sore throat and trouble swallowing.   Eyes:  Negative for photophobia, pain and visual disturbance.  Respiratory:  Negative for cough, shortness of breath and wheezing.   Cardiovascular:  Negative for chest pain, palpitations and leg swelling.  Gastrointestinal:  Positive for vomiting. Negative for abdominal pain, blood in stool and diarrhea.  Genitourinary:  Negative for difficulty urinating.  Musculoskeletal:  Positive for neck pain.  Skin:  Negative for rash.  Neurological:  Positive for numbness and headaches. Negative for dizziness, syncope and light-headedness.    Patient History  Allergies: No Known Allergies   History reviewed. No  pertinent past medical history. History reviewed. No pertinent surgical history. Social History   Socioeconomic History  . Marital status: Not on file    Spouse name: Not on file  . Number of children: Not on file  . Years of education: Not on file  . Highest education level: Not on file  Occupational History  . Not on file  Tobacco Use  . Smoking status: Every Day    Types: Cigarettes  . Smokeless tobacco: Never  Substance and Sexual Activity  . Alcohol use: Not on file  . Drug use: Not on file  . Sexual activity: Not on file  Other Topics Concern  . Not on file  Social History Narrative  . Not on file   History reviewed. No pertinent family history. No current outpatient medications on file prior to visit.   No current facility-administered medications on file prior to visit.     Objective  Vitals:   10/03/22 1444  BP: (!) 159/105  BP Location: Left arm  Patient Position: Sitting  Pulse: (!) 125  Resp: 20  Temp: 37 C (98.6 F)  TempSrc: Oral  SpO2: 99%  Weight: 77.1 kg  Height: 5' 7                No results found.  Physical Exam Vitals and nursing note reviewed.  Constitutional:      General: He is not in acute distress.    Appearance: Normal appearance. He is not ill-appearing, toxic-appearing or diaphoretic.  HENT:     Head: Normocephalic and atraumatic.     Mouth/Throat:  Mouth: Mucous membranes are moist.     Pharynx: Oropharynx is clear.  Eyes:     Extraocular Movements: Extraocular movements intact.     Conjunctiva/sclera: Conjunctivae normal.     Pupils: Pupils are equal, round, and reactive to light.  Cardiovascular:     Rate and Rhythm: Regular rhythm. Tachycardia present.     Pulses: Normal pulses.     Heart sounds: Normal heart sounds.  Pulmonary:     Effort: Pulmonary effort is normal. No respiratory distress.     Breath sounds: Normal breath sounds. No wheezing.  Abdominal:     General: Bowel sounds are normal. There  is no distension.     Palpations: Abdomen is soft.     Tenderness: There is no abdominal tenderness.  Musculoskeletal:     Cervical back: Normal range of motion and neck supple.  Skin:    General: Skin is warm.  Neurological:     General: No focal deficit present.     Mental Status: He is alert and oriented to person, place, and time.     GCS: GCS eye subscore is 4. GCS verbal subscore is 5. GCS motor subscore is 6.     Cranial Nerves: Cranial nerves 2-12 are intact.     Sensory: Sensation is intact.     Motor: Motor function is intact.     Coordination: Coordination is intact.     Gait: Gait is intact.      Results for orders placed or performed in visit on 10/03/22  ECG 12 lead   Narrative   ECG Interpretation:  Rhythm: Sinus tachycardia at 103 beats per minute Axis: Left axis deviation Intervals: Normal PR interval QRS Complex: Normal ST Segment: Normal ST-T segments QT Interval: Normal  Compared with prior: No, None Available.   Summary of Clinical Condition:  LAFB, left axis deviation, ECG without  significant abnormalities   Interpretation by Eva Rase, PA      Procedures MDM:     1 Undiagnosed new problem with uncertain prognosis     Explanation of Medical Decision Making and variances from expected care:  Patient stable, no acute distress.  No mental status changes.  Discussed with patient and his brother who is with him that further evaluation in the ER is needed based on his presenting symptoms and medical history.  ECG without acute changes.  I contacted the ER triage nurse as [place] and gave report.  Patients brother consented to drive patient to the ER for further evaluation    Review of prior notes: One     Risk:: High          Assessment/Plan Diagnoses and all orders for this visit:  Headache -     ECG 12 lead  Vomiting  Paresthesias -     ECG 12 lead  Hiccups  History of encephalopathy      Disposition Status:  Emergency Department  Progress note signed by Eva Rase, PA on 10/03/22 at  3:33 PM

## 2022-10-03 NOTE — ED Provider Notes (Signed)
[place] EMERGENCY DEPARTMENT AT [facility] Provider Note   CSN: [id number] Arrival date & time: 10/03/22  1547     History {Add pertinent medical, surgical, social history, OB history to HPI:1} No chief complaint on file.   Antonio Villegas is a 62 y.o. male.  HPI     Severe acid reflux, throwing up, indigestion in chest, headaches, back of neck hurts  Since left the hospital in April began having headaches, and the neck pain. Numbness tip of fingers, all of them, 24/7 headache and neck pain, difficult to sleep due to pain  Has had hiccups for about 2 years all day every day  Right side has been weak since April Trouble walking since April, right leg No other dizziness. Denies difficulty talking or walking, visual changes or facial droop.    Nausea and vomiting for 6 months, throws up 10 times a day,  does not take anything for it Vomiting from the hiccups.  Hiatal hernia on ct in April.   No diarrhea, abdominal pain Sensation of indigestion, 6 months. Tums will help.   Dont take other medications, otc headache medications Hands cramping up in AM  No etoh, smoke cigars, no other drugs     Past Medical History:  Diagnosis Date   Anemia    Metabolic encephalopathy      Home Medications Prior to Admission medications   Medication Sig Start Date End Date Taking? Authorizing Provider  guaiFENesin-dextromethorphan (ROBITUSSIN DM) 100-10 MG/5ML syrup Take 10 mLs by mouth every 6 (six) hours as needed for cough. 08/03/22   Dorcas Carrow, MD  nicotine (NICODERM CQ - DOSED IN MG/24 HOURS) 14 mg/24hr patch Place 1 patch (14 mg total) onto the skin daily. 08/04/22   Dorcas Carrow, MD      Allergies    Patient has no known allergies.    Review of Systems   Review of Systems  Physical Exam Updated Vital Signs BP (!) 150/109   Pulse 100   Temp 98.4 F (36.9 C)   Resp 16   SpO2 95%  Physical Exam  ED Results / Procedures / Treatments    Labs (all labs ordered are listed, but only abnormal results are displayed) Labs Reviewed  COMPREHENSIVE METABOLIC PANEL - Abnormal; Notable for the following components:      Result Value   Potassium 3.1 (*)    Chloride 94 (*)    CO2 33 (*)    Glucose, Bld 135 (*)    Creatinine, Ser 1.46 (*)    Calcium 10.6 (*)    Total Bilirubin 1.3 (*)    GFR, Estimated 54 (*)    All other components within normal limits  CBC - Abnormal; Notable for the following components:   Hemoglobin 12.8 (*)    All other components within normal limits  LIPASE, BLOOD  URINALYSIS, ROUTINE W REFLEX MICROSCOPIC    EKG None  Radiology CT Head Wo Contrast  Result Date: 10/03/2022 CLINICAL DATA:  Headache. EXAM: CT HEAD WITHOUT CONTRAST TECHNIQUE: Contiguous axial images were obtained from the base of the skull through the vertex without intravenous contrast. RADIATION DOSE REDUCTION: This exam was performed according to the departmental dose-optimization program which includes automated exposure control, adjustment of the mA and/or kV according to patient size and/or use of iterative reconstruction technique. COMPARISON:  July 30, 2022 FINDINGS: Brain: No evidence of acute infarction, hemorrhage, hydrocephalus, extra-axial collection or mass lesion/mass effect. Vascular: No hyperdense vessel or unexpected calcification. Skull: Normal. Negative  for fracture or focal lesion. Sinuses/Orbits: There is marked severity left maxillary sinus mucosal thickening with chronic thickening of the walls of the left maxillary sinus. A chronic deformity of the medial wall the left maxillary sinus is again noted. Other: None. IMPRESSION: 1. No acute intracranial abnormality. 2. Chronic left maxillary sinus disease. Electronically Signed   By: Aram Candela M.D.   On: 10/03/2022 20:09    Procedures Procedures  {Document cardiac monitor, telemetry assessment procedure when appropriate:1}  Medications Ordered in ED Medications  - No data to display  ED Course/ Medical Decision Making/ A&P   {   Click here for ABCD2, HEART and other calculatorsREFRESH Note before signing :1}                          Medical Decision Making Amount and/or Complexity of Data Reviewed Labs: ordered. Radiology: ordered.   ***  {Document critical care time when appropriate:1} {Document review of labs and clinical decision tools ie heart score, Chads2Vasc2 etc:1}  {Document your independent review of radiology images, and any outside records:1} {Document your discussion with family members, caretakers, and with consultants:1} {Document social determinants of health affecting pt's care:1} {Document your decision making why or why not admission, treatments were needed:1} Final Clinical Impression(s) / ED Diagnoses Final diagnoses:  None    Rx / DC Orders ED Discharge Orders     None

## 2022-10-03 NOTE — ED Triage Notes (Signed)
Arrived after being seen at [facility] for severe headaches, neck pain, vomiting, and hiccups. Pt has been having these symptoms for the last 2 months after getting out of the hospital for hyponatremia, AMS, and metabolic encephalopathy.  Pt is alert and oriented. Pt only complains of indigestion pain, neck, and headache with numbness in fingers in both hands.

## 2022-10-04 LAB — VITAMIN B12: Vitamin B-12: 239 pg/mL (ref 180–914)

## 2022-10-04 LAB — FOLATE: Folate: 27.7 ng/mL (ref >5.9)

## 2022-10-04 LAB — TROPONIN I (HIGH SENSITIVITY): Troponin I (High Sensitivity): 8 ng/L (ref <18)

## 2022-10-04 LAB — RPR: RPR Ser Ql: NONREACTIVE

## 2022-10-04 MED ORDER — METOCLOPRAMIDE HCL 10 MG PO TABS: 10.0000 mg | ORAL_TABLET | Freq: Four times a day (QID) | ORAL | 0 refills | Status: DC

## 2022-10-04 MED ORDER — GABAPENTIN 100 MG PO CAPS: 100.0000 mg | ORAL_CAPSULE | Freq: Three times a day (TID) | ORAL | 0 refills | Status: DC

## 2022-10-04 MED ORDER — PANTOPRAZOLE SODIUM 20 MG PO TBEC: 40.0000 mg | DELAYED_RELEASE_TABLET | Freq: Every day | ORAL | 0 refills | Status: DC

## 2022-10-04 NOTE — ED Notes (Signed)
Discharge instructions reviewed with patient. Patient questions answered and opportunity for education reviewed. Patient voices understanding of discharge instructions with no further questions. Patient ambulatory with steady gait to lobby.  

## 2022-10-06 LAB — VITAMIN B1: Vitamin B1 (Thiamine): 145.9 nmol/L (ref 66.5–200.0)

## 2023-01-03 ENCOUNTER — Ambulatory Visit: Payer: Medicaid Other | Admitting: Neurology

## 2023-01-03 ENCOUNTER — Encounter: Payer: Self-pay | Admitting: Neurology

## 2024-01-26 ENCOUNTER — Ambulatory Visit
Admission: EM | Admit: 2024-01-26 | Discharge: 2024-01-26 | Disposition: A | Discharge status: Home / Self Care | Attending: Emergency Medicine | Admitting: Emergency Medicine

## 2024-01-26 DIAGNOSIS — R066 Hiccough: Secondary | ICD-10-CM | POA: Diagnosis not present

## 2024-01-26 MED ORDER — BACLOFEN 10 MG PO TABS: 10.0000 mg | ORAL_TABLET | Freq: Three times a day (TID) | ORAL | 0 refills | Status: AC

## 2024-01-26 NOTE — ED Provider Notes (Signed)
 Antonio Villegas    CSN: [id number] Arrival date & time: 01/26/24  1215      History   Chief Complaint Chief Complaint  Patient presents with   Hiccups    HPI Antonio Villegas is a 63 y.o. male.  Patient presents with 3-year history of chronic daily hiccups which he reports are constant.  The hiccups disrupt his sleep.  No fever or vomiting.  He reports various treatments attempted over the last 3 years without success.  He has been seen in the emergency department for this but did not improve with the medications given.  He does not currently have a PCP.  His medical history includes delirium tremens, metabolic encephalopathy, EtOH withdrawal.  The history is provided by the patient and medical records.    Past Medical History:  Diagnosis Date   Anemia    Metabolic encephalopathy     Patient Active Problem List   Diagnosis Date Noted   Metabolic encephalopathy 07/31/2022   Hyponatremia 07/30/2022   Encephalopathy acute 07/30/2022   Delirium tremens (HCC) 07/30/2022   Hypokalemia 07/30/2022   AKI (acute kidney injury) 07/30/2022    History reviewed. No pertinent surgical history.     Home Medications    Prior to Admission medications   Medication Sig Start Date End Date Taking? Authorizing Provider  baclofen (LIORESAL) 10 MG tablet Take 1 tablet (10 mg total) by mouth 3 (three) times daily. 01/26/24  Yes Corlis Burnard DEL, NP  gabapentin  (NEURONTIN ) 100 MG capsule Take 1 capsule (100 mg total) by mouth 3 (three) times daily. Patient not taking: Reported on 01/26/2024 10/04/22 11/03/22  Dreama Longs, MD  guaiFENesin -dextromethorphan  (ROBITUSSIN DM) 100-10 MG/5ML syrup Take 10 mLs by mouth every 6 (six) hours as needed for cough. Patient not taking: Reported on 01/26/2024 08/03/22   Raenelle Coria, MD  metoCLOPramide  (REGLAN ) 10 MG tablet Take 1 tablet (10 mg total) by mouth every 6 (six) hours. 10/04/22   Dreama Longs, MD  nicotine  (NICODERM CQ  - DOSED IN  MG/24 HOURS) 14 mg/24hr patch Place 1 patch (14 mg total) onto the skin daily. Patient not taking: Reported on 01/26/2024 08/04/22   Raenelle Coria, MD  pantoprazole  (PROTONIX ) 20 MG tablet Take 2 tablets (40 mg total) by mouth daily for 21 days. 10/04/22 10/25/22  Dreama Longs, MD    Family History History reviewed. No pertinent family history.  Social History Social History   Tobacco Use   Smoking status: Some Days    Types: Cigarettes, Cigars   Smokeless tobacco: Never  Vaping Use   Vaping status: Never Used  Substance Use Topics   Alcohol use: Never   Drug use: Never     Allergies   Patient has no known allergies.   Review of Systems Review of Systems  Constitutional:  Negative for chills and fever.  Gastrointestinal:  Negative for abdominal pain, nausea and vomiting.     Physical Exam Triage Vital Signs ED Triage Vitals  Encounter Vitals Group     BP 01/26/24 1241 125/86     Girls Systolic BP Percentile --      Girls Diastolic BP Percentile --      Boys Systolic BP Percentile --      Boys Diastolic BP Percentile --      Pulse Rate 01/26/24 1241 100     Resp 01/26/24 1241 18     Temp 01/26/24 1241 98.4 F (36.9 C)     Temp src --  SpO2 01/26/24 1241 95 %     Weight --      Height --      Head Circumference --      Peak Flow --      Pain Score 01/26/24 1249 10     Pain Loc --      Pain Education --      Exclude from Growth Chart --    No data found.  Updated Vital Signs BP 125/86   Pulse 100   Temp 98.4 F (36.9 C)   Resp 18   SpO2 95%   Visual Acuity Right Eye Distance:   Left Eye Distance:   Bilateral Distance:    Right Eye Near:   Left Eye Near:    Bilateral Near:     Physical Exam Constitutional:      General: He is not in acute distress.    Comments: hiccups  HENT:     Mouth/Throat:     Mouth: Mucous membranes are moist.  Cardiovascular:     Rate and Rhythm: Normal rate and regular rhythm.     Heart sounds: Normal  heart sounds.  Pulmonary:     Effort: Pulmonary effort is normal. No respiratory distress.     Breath sounds: Normal breath sounds.  Abdominal:     General: Bowel sounds are normal.     Palpations: Abdomen is soft.     Tenderness: There is no abdominal tenderness. There is no guarding or rebound.  Neurological:     Mental Status: He is alert.      UC Treatments / Results  Labs (all labs ordered are listed, but only abnormal results are displayed) Labs Reviewed - No data to display  EKG   Radiology No results found.  Procedures Procedures (including critical care time)  Medications Ordered in UC Medications - No data to display  Initial Impression / Assessment and Plan / UC Course  I have reviewed the triage vital signs and the nursing notes.  Pertinent labs & imaging results that were available during my care of the patient were reviewed by me and considered in my medical decision making (see chart for details).    Chronic hiccups.  Afebrile and vital signs are stable.  Patient reports daily hiccups for the past 3 years.  Several medications attempted without relief, including Protonix , Reglan , gabapentin .  Attempting treatment today with baclofen; 15 tablets prescribed.  Precautions for drowsiness with baclofen discussed with patient at length and instructed him not to consume EtOH while taking baclofen.  Education provided on hiccups.  He does not currently have a PCP.  Appointment scheduled for patient to establish with a PCP on 03/04/2024 at [facility].  ED precautions given.  He agrees to plan of care.  Final Clinical Impressions(s) / UC Diagnoses   Final diagnoses:  Chronic hiccups     Discharge Instructions      Establish a primary care provider as scheduled.  Go to the emergency department if you have worsening symptoms.    Take the baclofen as directed.  Do not drive, operate machinery, drink alcohol, or perform dangerous activities while  taking this medication as it may cause drowsiness.       ED Prescriptions     Medication Sig Dispense Auth. Provider   baclofen (LIORESAL) 10 MG tablet Take 1 tablet (10 mg total) by mouth 3 (three) times daily. 15 each Corlis Burnard DEL, NP      I have reviewed the PDMP during  this encounter.   Corlis Burnard DEL, NP 01/26/24 1321

## 2024-01-26 NOTE — Discharge Instructions (Addendum)
 Establish a primary care provider as scheduled.  Go to the emergency department if you have worsening symptoms.    Take the baclofen as directed.  Do not drive, operate machinery, drink alcohol, or perform dangerous activities while taking this medication as it may cause drowsiness.

## 2024-01-26 NOTE — ED Triage Notes (Signed)
 Patient to Urgent Care with complaints of constant hiccups.  Symptoms x3 years. Worse over the last 2 weeks. Reports poor sleep/ difficulty eating.   Prescribed protonix  x3 daily with little relief.

## 2024-03-04 ENCOUNTER — Ambulatory Visit: Admitting: Family Medicine

## 2024-03-04 ENCOUNTER — Encounter: Payer: Self-pay | Admitting: Family Medicine

## 2024-03-04 VITALS — BP 184/130 | HR 89 | Temp 98.6°F | Ht 67.0 in | Wt 200.7 lb

## 2024-03-04 DIAGNOSIS — K219 Gastro-esophageal reflux disease without esophagitis: Secondary | ICD-10-CM

## 2024-03-04 DIAGNOSIS — Z79899 Other long term (current) drug therapy: Secondary | ICD-10-CM | POA: Diagnosis not present

## 2024-03-04 DIAGNOSIS — R066 Hiccough: Secondary | ICD-10-CM | POA: Diagnosis not present

## 2024-03-04 DIAGNOSIS — E66811 Obesity, class 1: Secondary | ICD-10-CM | POA: Diagnosis not present

## 2024-03-04 DIAGNOSIS — Z0000 Encounter for general adult medical examination without abnormal findings: Secondary | ICD-10-CM

## 2024-03-04 DIAGNOSIS — R030 Elevated blood-pressure reading, without diagnosis of hypertension: Secondary | ICD-10-CM

## 2024-03-04 DIAGNOSIS — Z130 Encounter for screening for diseases of the blood and blood-forming organs and certain disorders involving the immune mechanism: Secondary | ICD-10-CM

## 2024-03-04 DIAGNOSIS — Z0001 Encounter for general adult medical examination with abnormal findings: Secondary | ICD-10-CM

## 2024-03-04 DIAGNOSIS — Z136 Encounter for screening for cardiovascular disorders: Secondary | ICD-10-CM

## 2024-03-04 DIAGNOSIS — K1370 Unspecified lesions of oral mucosa: Secondary | ICD-10-CM

## 2024-03-04 DIAGNOSIS — Z1159 Encounter for screening for other viral diseases: Secondary | ICD-10-CM

## 2024-03-04 MED ORDER — METOCLOPRAMIDE HCL 5 MG PO TABS: 5.0000 mg | ORAL_TABLET | Freq: Four times a day (QID) | ORAL | 0 refills | Status: DC

## 2024-03-04 MED ORDER — PANTOPRAZOLE SODIUM 40 MG PO TBEC
40.0000 mg | DELAYED_RELEASE_TABLET | Freq: Every day | ORAL | 3 refills | Status: AC
Start: 2024-03-04 — End: ?

## 2024-03-04 MED ORDER — FAMOTIDINE 40 MG PO TABS: 40.0000 mg | ORAL_TABLET | Freq: Every day | ORAL | 0 refills | Status: DC

## 2024-03-04 NOTE — Progress Notes (Signed)
 "   New patient visit   Patient: Antonio Villegas   DOB: December 15, 1960   63 y.o. Male  MRN: [id number] Visit Date: 03/04/2024  Today's healthcare provider: LAURAINE LOISE BUOY, DO   Chief Complaint  Patient presents with   New Patient (Initial Visit)    Patient is here to be established with a primary provider.    Complaints of hiccups and has been going on for 2 years.  Vaccines- declined   Subjective    Antonio Villegas is a 63 y.o. male who presents today as a new patient to establish care.  HPI HPI     New Patient (Initial Visit)    Additional comments: Patient is here to be established with a primary provider.    Complaints of hiccups and has been going on for 2 years.  Vaccines- declined      Last edited by Terrel Powell CROME, CMA on 03/04/2024  2:20 PM.       Antonio Villegas is a 63 year old male who presents with chronic hiccups and associated heartburn.  He has been experiencing persistent hiccups for the past two years, significantly impacting his quality of life and forcing early retirement due to communication difficulties. The hiccups occur intermittently throughout the day, lasting from 30 minutes to two hours. Drinking sodas provides temporary relief, but he avoids them due to concerns about kidney health. Previous treatment with baclofen  was ineffective, causing drowsiness without alleviating the hiccups. No other medications have been tried for this issue.  He experiences daily heartburn, which he associates with the hiccups. Past medication for heartburn was ineffective and lacked refills, and he currently takes no medication for it. He experiences nausea and vomiting related to indigestion when the hiccups occur. No chest pain, shortness of breath, dizziness, headaches, vision problems, fever, chills, or abnormal fatigue. Occasionally, he experiences tingling in his hands, attributed to his sleeping position.  He drinks alcohol infrequently, about once a  month, consuming one shot and two beers during social gatherings. He has no issues with constipation or diarrhea.     Past Medical History:  Diagnosis Date   Anemia    Metabolic encephalopathy    History reviewed. No pertinent surgical history. No family status information on file.   History reviewed. No pertinent family history. Social History   Socioeconomic History   Marital status: Divorced    Spouse name: Not on file   Number of children: Not on file   Years of education: Not on file   Highest education level: Not on file  Occupational History   Not on file  Tobacco Use   Smoking status: Every Day    Types: Cigars   Smokeless tobacco: Never  Vaping Use   Vaping status: Never Used  Substance and Sexual Activity   Alcohol use: Yes    Alcohol/week: 3.0 standard drinks of alcohol    Types: 2 Cans of beer, 1 Shots of liquor per week    Comment: Once a month.   Drug use: Never   Sexual activity: Not on file  Other Topics Concern   Not on file  Social History Narrative   Not on file   Social Drivers of Health   Tobacco Use: High Risk (03/04/2024)   Patient History    Smoking Tobacco Use: Every Day    Smokeless Tobacco Use: Never    Passive Exposure: Not on file  Financial Resource Strain: Low Risk (03/04/2024)   Overall Financial Resource Strain (CARDIA)  Difficulty of Paying Living Expenses: Not hard at all  Food Insecurity: No Food Insecurity (03/04/2024)   Epic    Worried About Programme Researcher, Broadcasting/film/video in the Last Year: Never true    Ran Out of Food in the Last Year: Never true  Transportation Needs: No Transportation Needs (03/04/2024)   Epic    Lack of Transportation (Medical): No    Lack of Transportation (Non-Medical): No  Physical Activity: Inactive (03/04/2024)   Exercise Vital Sign    Days of Exercise per Week: 0 days    Minutes of Exercise per Session: 0 min  Stress: No Stress Concern Present (03/04/2024)   [employer] Occup[employer]  [employer] - Occupational Stress Questionnaire    Feeling of Stress: Not at all  Social Connections: Not on file  Depression (PHQ2-9): Low Risk (03/04/2024)   Depression (PHQ2-9)    PHQ-2 Score: 0  Alcohol Screen: Low Risk (03/04/2024)   Alcohol Screen    Last Alcohol Screening Score (AUDIT): 0  Housing: Low Risk (03/04/2024)   Epic    Unable to Pay for Housing in the Last Year: No    Number of Times Moved in the Last Year: 0    Homeless in the Last Year: No  Utilities: Not At Risk (03/04/2024)   Epic    Threatened with loss of utilities: No  Health Literacy: Adequate Health Literacy (03/04/2024)   B1300 Health Literacy    Frequency of need for help with medical instructions: Never   Outpatient Medications Prior to Visit  Medication Sig   baclofen  (LIORESAL ) 10 MG tablet Take 1 tablet (10 mg total) by mouth 3 (three) times daily.   [DISCONTINUED] gabapentin  (NEURONTIN ) 100 MG capsule Take 1 capsule (100 mg total) by mouth 3 (three) times daily. (Patient not taking: Reported on 01/26/2024)   [DISCONTINUED] guaiFENesin -dextromethorphan  (ROBITUSSIN DM) 100-10 MG/5ML syrup Take 10 mLs by mouth every 6 (six) hours as needed for cough. (Patient not taking: Reported on 01/26/2024)   [DISCONTINUED] metoCLOPramide  (REGLAN ) 10 MG tablet Take 1 tablet (10 mg total) by mouth every 6 (six) hours.   [DISCONTINUED] nicotine  (NICODERM CQ  - DOSED IN MG/24 HOURS) 14 mg/24hr patch Place 1 patch (14 mg total) onto the skin daily. (Patient not taking: Reported on 01/26/2024)   [DISCONTINUED] pantoprazole  (PROTONIX ) 20 MG tablet Take 2 tablets (40 mg total) by mouth daily for 21 days.   No facility-administered medications prior to visit.   No Known Allergies  Immunization History  Administered Date(s) Administered   Janssen (J&J) SARS-COV-2 Vaccination 02/22/2020    Health Maintenance  Topic Date Due   Hepatitis C Screening  04/11/2024 (Originally 05/13/1978)   Colonoscopy  05/30/2024 (Originally  05/13/2005)   Zoster Vaccines- Shingrix (1 of 2) 06/04/2024 (Originally 05/14/1979)   Influenza Vaccine  07/09/2024 (Originally 11/10/2023)   COVID-19 Vaccine (2 - Janssen risk series) 12/10/2024 (Originally 03/21/2020)   DTaP/Tdap/Td (1 - Tdap) 03/04/2025 (Originally 05/14/1979)   Pneumococcal Vaccine: 50+ Years (1 of 2 - PCV) 03/04/2025 (Originally 05/14/1979)   HIV Screening  Completed   Hepatitis B Vaccines 19-59 Average Risk  Aged Out   HPV VACCINES  Aged Out   Meningococcal B Vaccine  Aged Out    Patient Care Team: Tamma Brigandi, Lauraine SAILOR, DO as PCP - General (Family Medicine)  Review of Systems  Constitutional:  Negative for appetite change, chills, fatigue and fever.  HENT:  Positive for rhinorrhea (with the hiccups). Negative for congestion, ear pain, hearing loss, nosebleeds and trouble swallowing.  Eyes:  Negative for pain and visual disturbance.  Respiratory:  Negative for cough, chest tightness and shortness of breath.   Cardiovascular:  Negative for chest pain, palpitations and leg swelling.  Gastrointestinal:  Positive for vomiting (occasionally with the hiccups, after he eats). Negative for abdominal pain, blood in stool, constipation, diarrhea and nausea.  Endocrine: Negative for polydipsia, polyphagia and polyuria.  Genitourinary:  Negative for dysuria and flank pain.  Musculoskeletal:  Negative for arthralgias, back pain, joint swelling, myalgias and neck stiffness.  Skin:  Negative for color change, rash and wound.  Neurological:  Negative for dizziness, tremors, seizures, speech difficulty, weakness, light-headedness and headaches.  Psychiatric/Behavioral:  Negative for behavioral problems, confusion, decreased concentration, dysphoric mood and sleep disturbance. The patient is not nervous/anxious.   All other systems reviewed and are negative.       Objective    BP (!) 184/130 (BP Location: Left Arm, Cuff Size: Large)   Pulse 89   Temp 98.6 F (37 C) (Oral)   Ht 5' 7  (1.702 m)   Wt 200 lb 11.2 oz (91 kg)   SpO2 97%   BMI 31.43 kg/m     Physical Exam Vitals and nursing note reviewed.  Constitutional:      General: He is awake.     Appearance: Normal appearance.  HENT:     Head: Normocephalic and atraumatic.     Right Ear: Tympanic membrane, ear canal and external ear normal.     Left Ear: Tympanic membrane, ear canal and external ear normal.     Nose: Nose normal.     Mouth/Throat:     Mouth: Mucous membranes are moist.     Pharynx: Oropharynx is clear. No oropharyngeal exudate or posterior oropharyngeal erythema.  Eyes:     General: No scleral icterus.    Extraocular Movements: Extraocular movements intact.     Conjunctiva/sclera: Conjunctivae normal.     Pupils: Pupils are equal, round, and reactive to light.  Neck:     Thyroid: No thyromegaly or thyroid tenderness.  Cardiovascular:     Rate and Rhythm: Normal rate and regular rhythm.     Pulses: Normal pulses.     Heart sounds: Normal heart sounds.  Pulmonary:     Effort: Pulmonary effort is normal. No tachypnea, bradypnea or respiratory distress.     Breath sounds: Normal breath sounds. No stridor. No wheezing, rhonchi or rales.  Abdominal:     General: Bowel sounds are normal. There is no distension.     Palpations: Abdomen is soft. There is no mass.     Tenderness: There is no abdominal tenderness. There is no guarding.     Hernia: No hernia is present.  Musculoskeletal:     Cervical back: Normal range of motion and neck supple.     Right lower leg: No edema.     Left lower leg: No edema.  Lymphadenopathy:     Cervical: No cervical adenopathy.  Skin:    General: Skin is warm and dry.  Neurological:     Mental Status: He is alert and oriented to person, place, and time. Mental status is at baseline.  Psychiatric:        Mood and Affect: Mood normal.        Behavior: Behavior normal.     Depression Screen    03/04/2024    2:32 PM  PHQ 2/9 Scores  PHQ - 2 Score 0   PHQ- 9 Score 0   No results  found for any visits on 03/04/24.  Assessment & Plan     Encounter for medical examination to establish care  Intractable hiccups -     Metoclopramide  HCl; Take 1 tablet (5 mg total) by mouth 4 (four) times daily.  Dispense: 120 tablet; Refill: 0  Gastroesophageal reflux disease, unspecified whether esophagitis present -     Pantoprazole  Sodium; Take 1 tablet (40 mg total) by mouth daily before breakfast.  Dispense: 30 tablet; Refill: 3 -     Famotidine ; Take 1 tablet (40 mg total) by mouth at bedtime.  Dispense: 30 tablet; Refill: 0 -     Vitamin B12  High risk medication use -     Vitamin B12  Elevated blood pressure reading without diagnosis of hypertension  Oral lesion -     Ambulatory referral to ENT  Obesity (BMI 30.0-34.9) -     Hemoglobin A1c -     VITAMIN D 25 Hydroxy (Vit-D Deficiency, Fractures)  Hypercalcemia -     Comprehensive metabolic panel with GFR -     VITAMIN D 25 Hydroxy (Vit-D Deficiency, Fractures)  Encounter for hepatitis C screening test for low risk patient -     HCV Ab w Reflex to Quant PCR  Screening for endocrine, metabolic and immunity disorder -     Hemoglobin A1c  Encounter for screening for cardiovascular disorders -     Lipid panel      Encounter for medical examination to establish care Routine visit with elevated blood pressure. Physical exam overall unremarkable except as noted above. Routine lab work ordered as noted.  Discussed shingles vaccine efficacy before age 41. - Ordered blood work including hepatitis C screening. - Recommended shingles vaccine before age 63.  Intractable hiccups Persisting for two years, associated with reflux symptoms. Previous baclofen  trial ineffective. Discussed potential endoscopy and amitriptyline if current treatment fails. - Prescribed pantoprazole  40 mg daily with three refills. - Prescribed famotidine  daily at bedtime for 30 days. - Prescribed metoclopramide   with instructions to continue for 10 days after hiccups resolve. - Will consider referral to GI for possible endoscopy if symptoms persist. - Will consider amitriptyline if current selected treatment fails.  Gastroesophageal reflux disease (GERD), unspecified whether esophagitis present; high risk medication use Daily heartburn and reflux symptoms. Previous pantoprazole  trial ineffective. Symptoms may contribute to chronic hiccups. - Prescribed pantoprazole  40 mg daily with three refills. - Prescribed famotidine  daily at bedtime for 30 days. - Prescribed metoclopramide  with instructions to continue for 10 days after hiccups resolve. - Will check vitamin B12 today due to previous level being at the lower end of normal, along with starting long-term PPI today.  Oral lesion Presence of a lump in the oral cavity, no discomfort reported. - Referred to ENT for evaluation of oral mucosal lesion.  Elevated blood pressure reading without diagnosis of hypertension Blood pressure elevated today in clinic.  However, this may be secondary to anxiety revolving around establishing care, as his blood pressure at his urgent care visit last month was 126/86.  Encouraged patient to check his blood pressures at home.  Plan to reassess on follow-up.    Return in about 1 month (around 04/03/2024) for recheck.     I discussed the assessment and treatment plan with the patient  The patient was provided an opportunity to ask questions and all were answered. The patient agreed with the plan and demonstrated an understanding of the instructions.   The patient was advised to call back or seek  an in-person evaluation if the symptoms worsen or if the condition fails to improve as anticipated.    LAURAINE LOISE BUOY, DO  [facility] [phone] (phone) [phone] (fax)  [facility] "

## 2024-04-05 ENCOUNTER — Ambulatory Visit (INDEPENDENT_AMBULATORY_CARE_PROVIDER_SITE_OTHER): Admitting: Family Medicine

## 2024-04-05 ENCOUNTER — Encounter: Payer: Self-pay | Admitting: Family Medicine

## 2024-04-05 VITALS — BP 163/100 | HR 94 | Temp 98.9°F | Ht 67.0 in | Wt 209.3 lb

## 2024-04-05 DIAGNOSIS — K219 Gastro-esophageal reflux disease without esophagitis: Secondary | ICD-10-CM | POA: Diagnosis not present

## 2024-04-05 DIAGNOSIS — R030 Elevated blood-pressure reading, without diagnosis of hypertension: Secondary | ICD-10-CM | POA: Diagnosis not present

## 2024-04-05 DIAGNOSIS — R066 Hiccough: Secondary | ICD-10-CM | POA: Diagnosis not present

## 2024-04-05 MED ORDER — FAMOTIDINE 40 MG PO TABS: 40.0000 mg | ORAL_TABLET | Freq: Every day | ORAL | 0 refills | Status: AC

## 2024-04-05 MED ORDER — METOCLOPRAMIDE HCL 10 MG PO TABS: 10.0000 mg | ORAL_TABLET | Freq: Four times a day (QID) | ORAL | 1 refills | Status: AC

## 2024-04-05 NOTE — Progress Notes (Signed)
 "     Established patient visit   Patient: Antonio Villegas   DOB: [birth year]   63 y.o. Male  MRN: [id number] Visit Date: 04/05/2024  Today's healthcare provider: LAURAINE LOISE BUOY, DO   Chief Complaint  Patient presents with   Medical Management of Chronic Issues    Patient is here today for hiccups, the medication that he was prescribed it does work but not stopped completely but not quite as aggressive.   Hypertension   Subjective    Hypertension   Antonio Villegas is a 63 year old male who presents with persistent hiccups and medication management.  He has been experiencing persistent hiccups that were initially aggressive and accompanied by vomiting, which has since resolved. He believes that taking Reglan  has helped reduce the severity of the hiccups. He completed his previous medication regimen as of yesterday.  He has been managing his symptoms with pantoprazole  but did not refill his prescription despite refills being available. He adjusted the dosage of his metoclopramide  to two pills twice a day instead of the prescribed four times a day due to timing constraints, with improvement of symptoms.  He mentions that the hiccups have impacted his ability to return to work. He has been in communication with his employer about his health status and has put his return to work on hold again, though he'd hoped his improvement would mean he could restart soon.      Medications: Show/hide medication list[1]       Objective    BP (!) 163/100 (BP Location: Left Arm, Cuff Size: Large)   Pulse 94   Temp 98.9 F (37.2 C) (Oral)   Ht 5' 7 (1.702 m)   Wt 209 lb 4.8 oz (94.9 kg)   SpO2 98%   BMI 32.78 kg/m     Physical Exam Vitals and nursing note reviewed.  Constitutional:      General: He is not in acute distress.    Appearance: Normal appearance.  HENT:     Head: Normocephalic and atraumatic.  Eyes:     General: No scleral icterus.    Conjunctiva/sclera:  Conjunctivae normal.  Cardiovascular:     Rate and Rhythm: Normal rate.  Pulmonary:     Effort: Pulmonary effort is normal.  Neurological:     Mental Status: He is alert and oriented to person, place, and time. Mental status is at baseline.  Psychiatric:        Mood and Affect: Mood normal.        Behavior: Behavior normal.      No results found for any visits on 04/05/24.  Assessment & Plan    Elevated blood-pressure reading, without diagnosis of hypertension  Intractable hiccups -     Metoclopramide  HCl; Take 1 tablet (10 mg total) by mouth 4 (four) times daily.  Dispense: 120 tablet; Refill: 1 -     H. pylori breath test  Gastroesophageal reflux disease, unspecified whether esophagitis present -     Famotidine ; Take 1 tablet (40 mg total) by mouth at bedtime.  Dispense: 30 tablet; Refill: 0 -     H. pylori breath test      Intractable hiccups Persistent hiccups, mildly improved with reduced aggressiveness of hiccups.  Previously took metoclopramide  dosing as 10 mg twice daily.  Discussed potential side effects, including extrapyramidal symptoms. - Increase metoclopramide  dose to 10 mg 4 times daily.  Cautioned patient against combining dosages and to only two administrations. - If hiccups resolved, continue  metoclopramide  for at least 10 days past resolution - Monitor for extrapyramidal symptoms; if present, stop metoclopramide  and consider Benadryl . - Consider gabapentin  or amitriptyline if current treatment fails.  Gastroesophageal reflux disease GERD managed with Protonix  and famotidine . Potential H. pylori contribution to symptoms. - Refill Protonix  prescription at pharmacy. - Refilled famotidine  prescription for one month. - Ordered H. pylori breath test.  Elevated blood-pressure reading without diagnosis hypertension Elevated blood pressure readings today and at previous visit but within normal readings while at the emergency department in October.  Suspected  white coat hypertension. - Monitor blood pressure at home and bring readings to next visit. - Bring blood pressure cuff to next visit for validation.    Return in about 4 weeks (around 05/03/2024).      I discussed the assessment and treatment plan with the patient  The patient was provided an opportunity to ask questions and all were answered. The patient agreed with the plan and demonstrated an understanding of the instructions.   The patient was advised to call back or seek an in-person evaluation if the symptoms worsen or if the condition fails to improve as anticipated.    LAURAINE LOISE BUOY, DO  [place] [facility] [phone] (phone) [phone] (fax)  [facility]    [1]  Outpatient Medications Prior to Visit  Medication Sig   baclofen  (LIORESAL ) 10 MG tablet Take 1 tablet (10 mg total) by mouth 3 (three) times daily.   pantoprazole  (PROTONIX ) 40 MG tablet Take 1 tablet (40 mg total) by mouth daily before breakfast.   [DISCONTINUED] famotidine  (PEPCID ) 40 MG tablet Take 1 tablet (40 mg total) by mouth at bedtime.   [DISCONTINUED] metoCLOPramide  (REGLAN ) 5 MG tablet Take 1 tablet (5 mg total) by mouth 4 (four) times daily.   No facility-administered medications prior to visit.   "

## 2024-05-06 ENCOUNTER — Ambulatory Visit: Admitting: Family Medicine

## 2024-05-28 ENCOUNTER — Ambulatory Visit: Admitting: Family Medicine
